# Patient Record
Sex: Female | Born: 1950 | Race: Black or African American | Hispanic: No | Smoking: Never smoker
Health system: Southern US, Community
[De-identification: ages and names within clinical notes are randomized; demographics above are authoritative.]

## PROBLEM LIST (undated history)

## (undated) DIAGNOSIS — T783XXA Angioneurotic edema, initial encounter: Secondary | ICD-10-CM

## (undated) DIAGNOSIS — Z5189 Encounter for other specified aftercare: Secondary | ICD-10-CM

## (undated) DIAGNOSIS — H409 Unspecified glaucoma: Secondary | ICD-10-CM

## (undated) DIAGNOSIS — D649 Anemia, unspecified: Secondary | ICD-10-CM

## (undated) DIAGNOSIS — E785 Hyperlipidemia, unspecified: Secondary | ICD-10-CM

## (undated) DIAGNOSIS — T7840XA Allergy, unspecified, initial encounter: Secondary | ICD-10-CM

## (undated) DIAGNOSIS — H269 Unspecified cataract: Secondary | ICD-10-CM

## (undated) DIAGNOSIS — L509 Urticaria, unspecified: Secondary | ICD-10-CM

## (undated) HISTORY — DX: Hyperlipidemia, unspecified: E78.5

## (undated) HISTORY — DX: Anemia, unspecified: D64.9

## (undated) HISTORY — DX: Urticaria, unspecified: L50.9

## (undated) HISTORY — DX: Unspecified glaucoma: H40.9

## (undated) HISTORY — DX: Angioneurotic edema, initial encounter: T78.3XXA

## (undated) HISTORY — DX: Allergy, unspecified, initial encounter: T78.40XA

## (undated) HISTORY — DX: Encounter for other specified aftercare: Z51.89

## (undated) HISTORY — DX: Unspecified cataract: H26.9

---

## 2002-02-01 ENCOUNTER — Ambulatory Visit (HOSPITAL_COMMUNITY): Admission: RE | Admit: 2002-02-01 | Discharge: 2002-02-01 | Payer: Self-pay | Admitting: Gastroenterology

## 2010-05-24 ENCOUNTER — Emergency Department (HOSPITAL_COMMUNITY): Admission: EM | Admit: 2010-05-24 | Discharge: 2010-05-24 | Payer: Self-pay | Admitting: Emergency Medicine

## 2010-11-18 ENCOUNTER — Other Ambulatory Visit: Payer: Self-pay | Admitting: Family Medicine

## 2010-11-18 DIAGNOSIS — Z1231 Encounter for screening mammogram for malignant neoplasm of breast: Secondary | ICD-10-CM

## 2010-11-23 ENCOUNTER — Ambulatory Visit
Admission: RE | Admit: 2010-11-23 | Discharge: 2010-11-23 | Disposition: A | Discharge status: Home / Self Care | Payer: BC Managed Care – PPO | Source: Ambulatory Visit | Attending: Family Medicine | Admitting: Family Medicine

## 2010-11-23 DIAGNOSIS — Z1231 Encounter for screening mammogram for malignant neoplasm of breast: Secondary | ICD-10-CM

## 2010-12-11 NOTE — Procedures (Signed)
[facility]  Patient:    Nicole Mclaughlin, Nicole Mclaughlin Visit Number: [id number] MRN: [id number]          Service Type: END Location: ENDO Attending Physician:  Nelda Marseille Dictated by:   Petra Kuba, M.D. Proc. Date: 02/01/02 Admit Date:  02/01/2002 Discharge Date: 02/01/2002                             Procedure Report  PROCEDURE:  Colonoscopy.  INDICATIONS FOR PROCEDURE:  Family history of colon cancer due for repeat screening.  Consent was signed after risks, benefits, methods, and options were thoroughly discussed in the past.  MEDICINES USED:  Demerol 80, Versed 8.  DESCRIPTION OF PROCEDURE:  Rectal inspection was pertinent for external hemorrhoids. Digital exam was negative. The video pediatric adjustable colonoscope was inserted and easily advanced around the colon to the cecum. This did not require any abdominal pressure or any position changes. The cecum was identified by the appendiceal orifice and the ileocecal valve. No abnormality was seen on insertion. The scope was slowly withdrawn. The prep was adequate. There was some liquid stool that required washing and suctioning. On slow withdrawal through the colon, no abnormalities were seen specifically no polyps or diverticula. There was some tortuosity particularly in the sigmoid. When we felt back around the loop in a sharp turn, we did try to readvance around the curve to decrease the chance of missing things. Once back in the rectum, the scope was retroflexed pertinent for some internal hemorrhoids. The scope was straightened and advanced a short ways up the left side of the colon, air was suctioned, scope removed. The patient tolerated the procedure well. There was no obvious or immediate complication.  ENDOSCOPIC DIAGNOSIS: 1. Tiny internal and external hemorrhoids. 2. Tortuous colon particularly in the sigmoid. 3. Otherwise within normal limits to the cecum.  PLAN:  GI follow-up  p.r.n. Repeat screening in five years. Yearly rectals and guaiacs per primary care or gynecology. Dictated by:   Petra Kuba, M.D. Attending Physician:  Nelda Marseille DD:  02/01/02 TD:  02/04/02 Job: [id number]

## 2011-05-13 IMAGING — CR DG ABDOMEN ACUTE W/ 1V CHEST
3 series · 3 of 3 positions shown · non-contrast
Comparison: None.

CLINICAL DATA: Constipation.

ACUTE ABDOMEN SERIES (ABDOMEN 2 VIEW & CHEST 1 VIEW)

[view not recorded (1 of 3)]
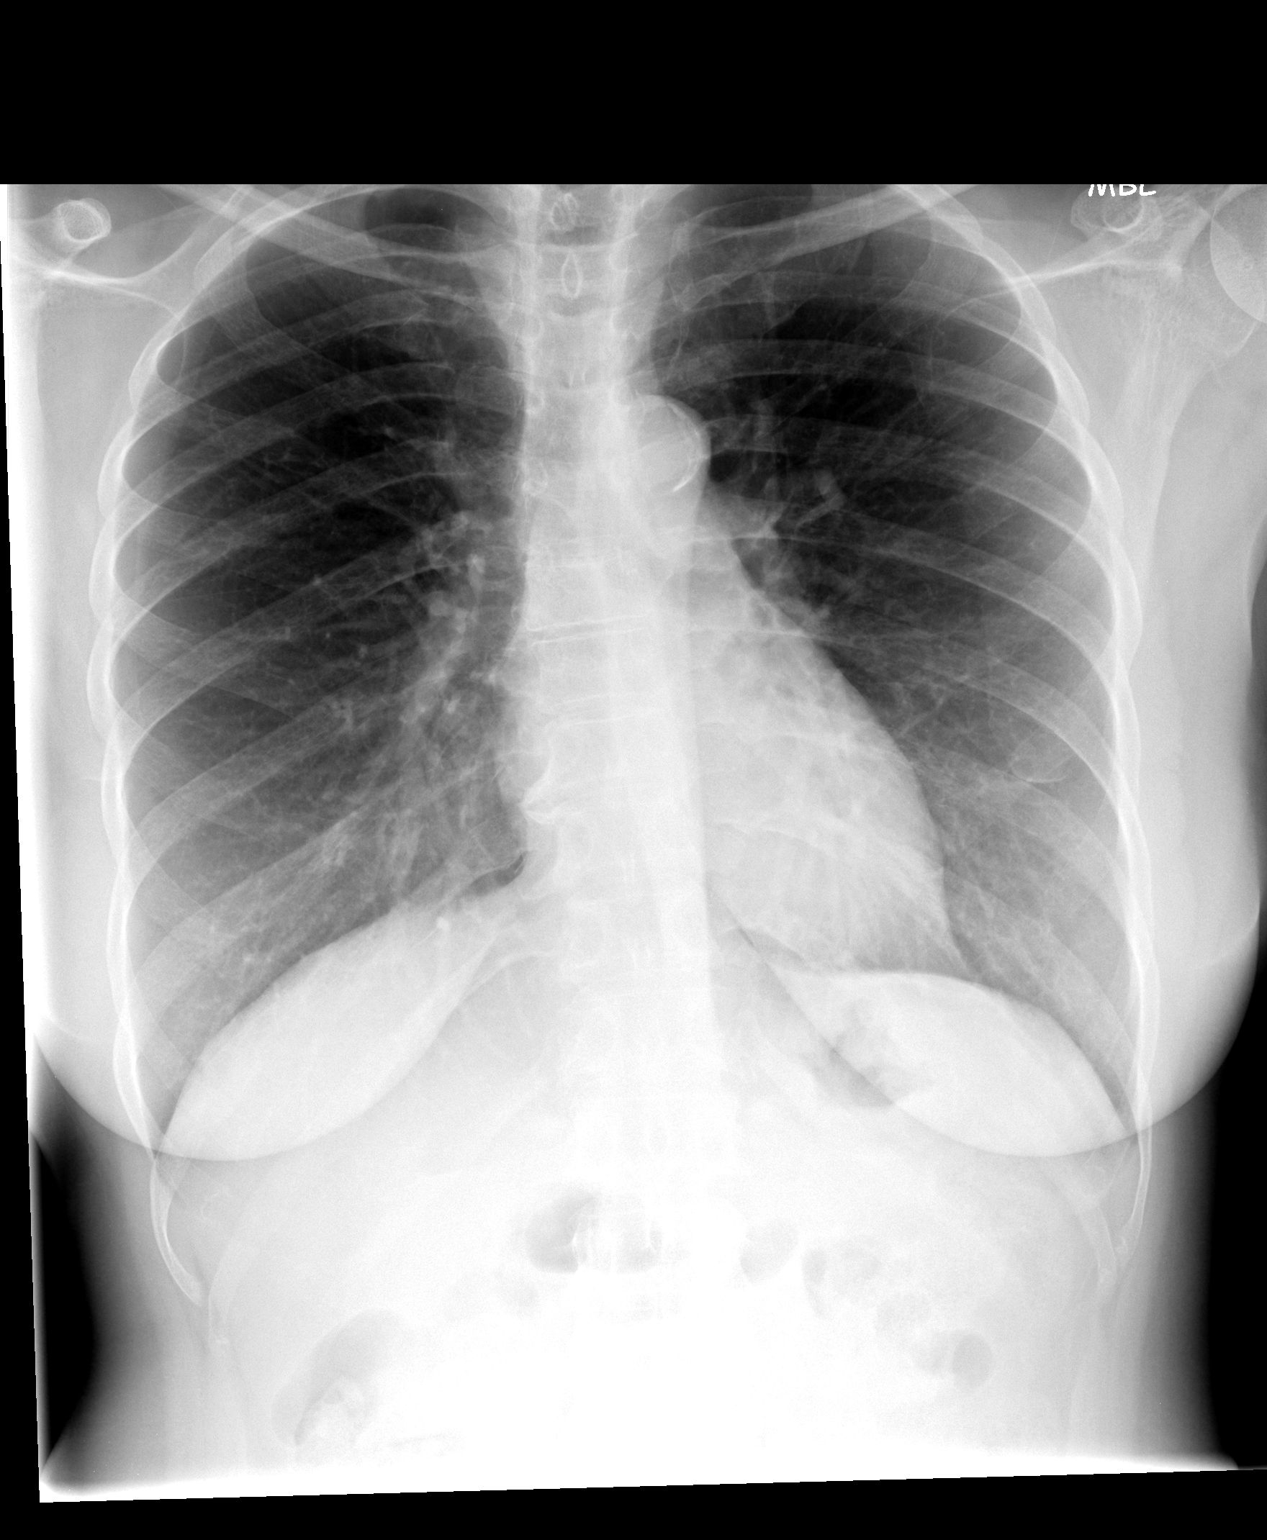

[view not recorded (2 of 3)]
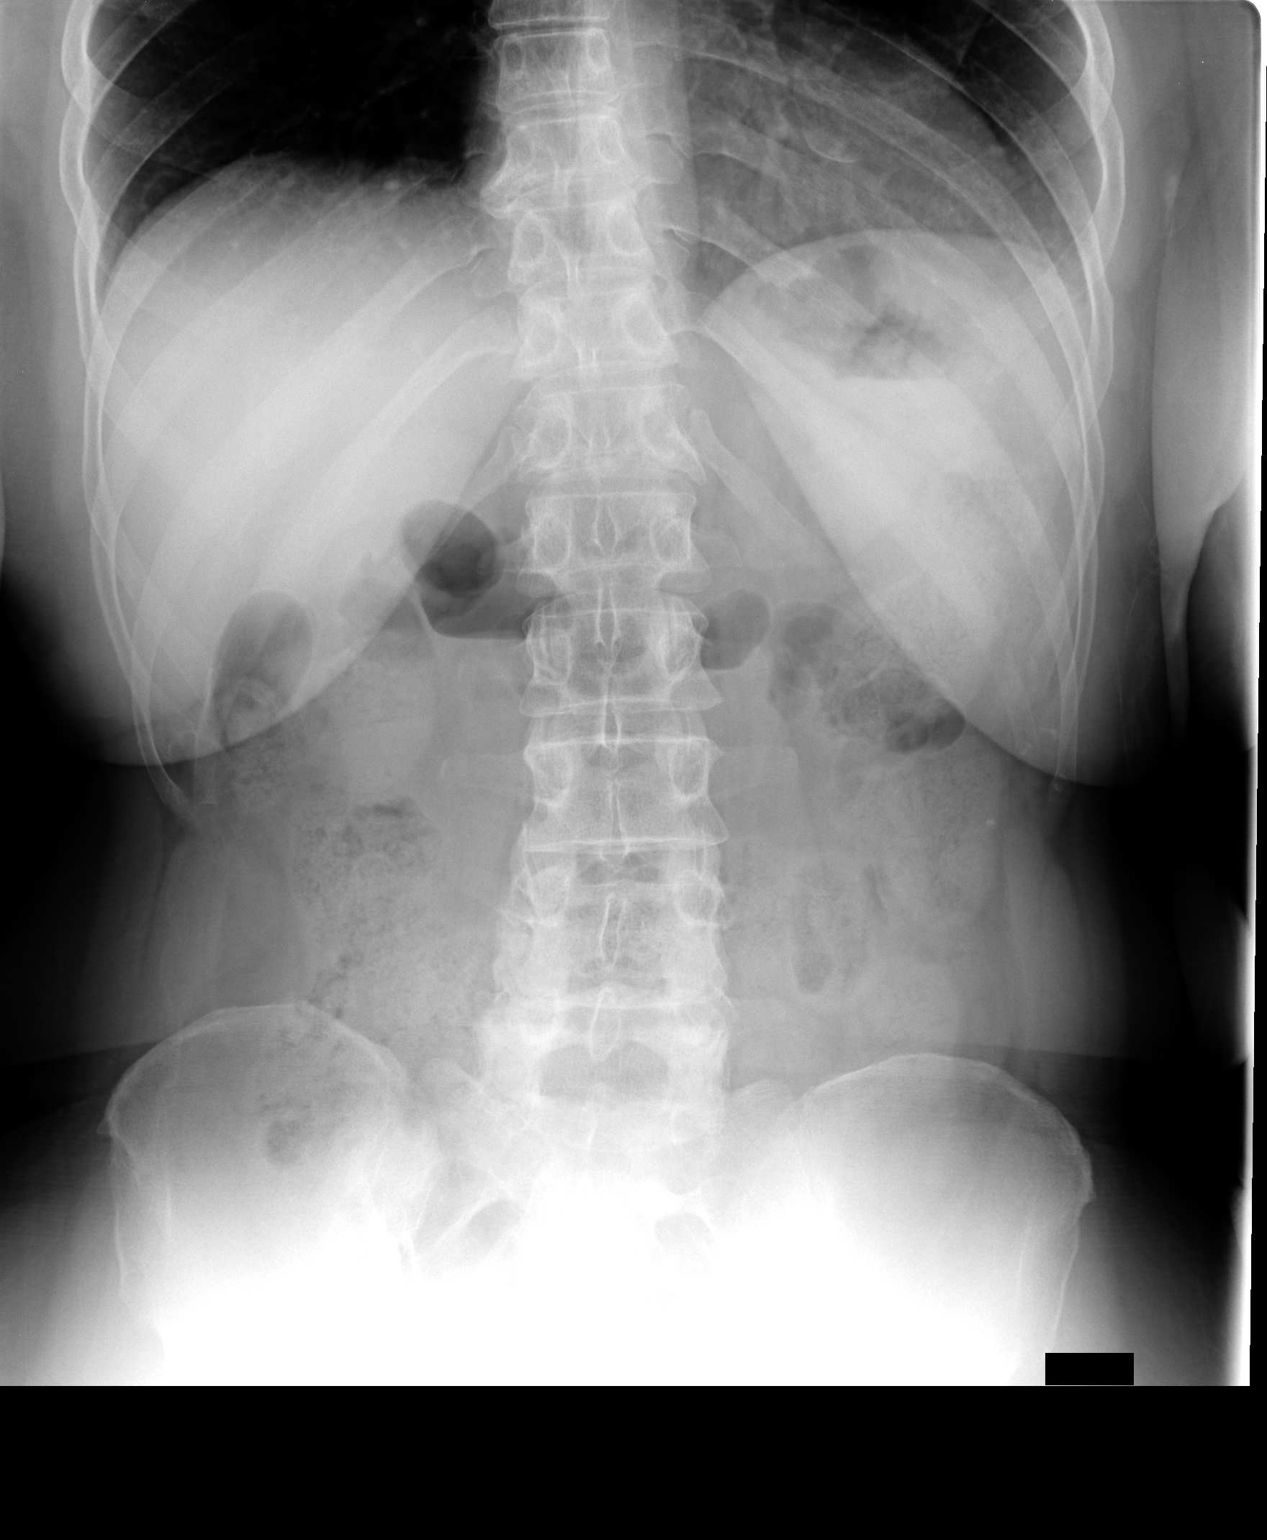

[view not recorded (3 of 3)]
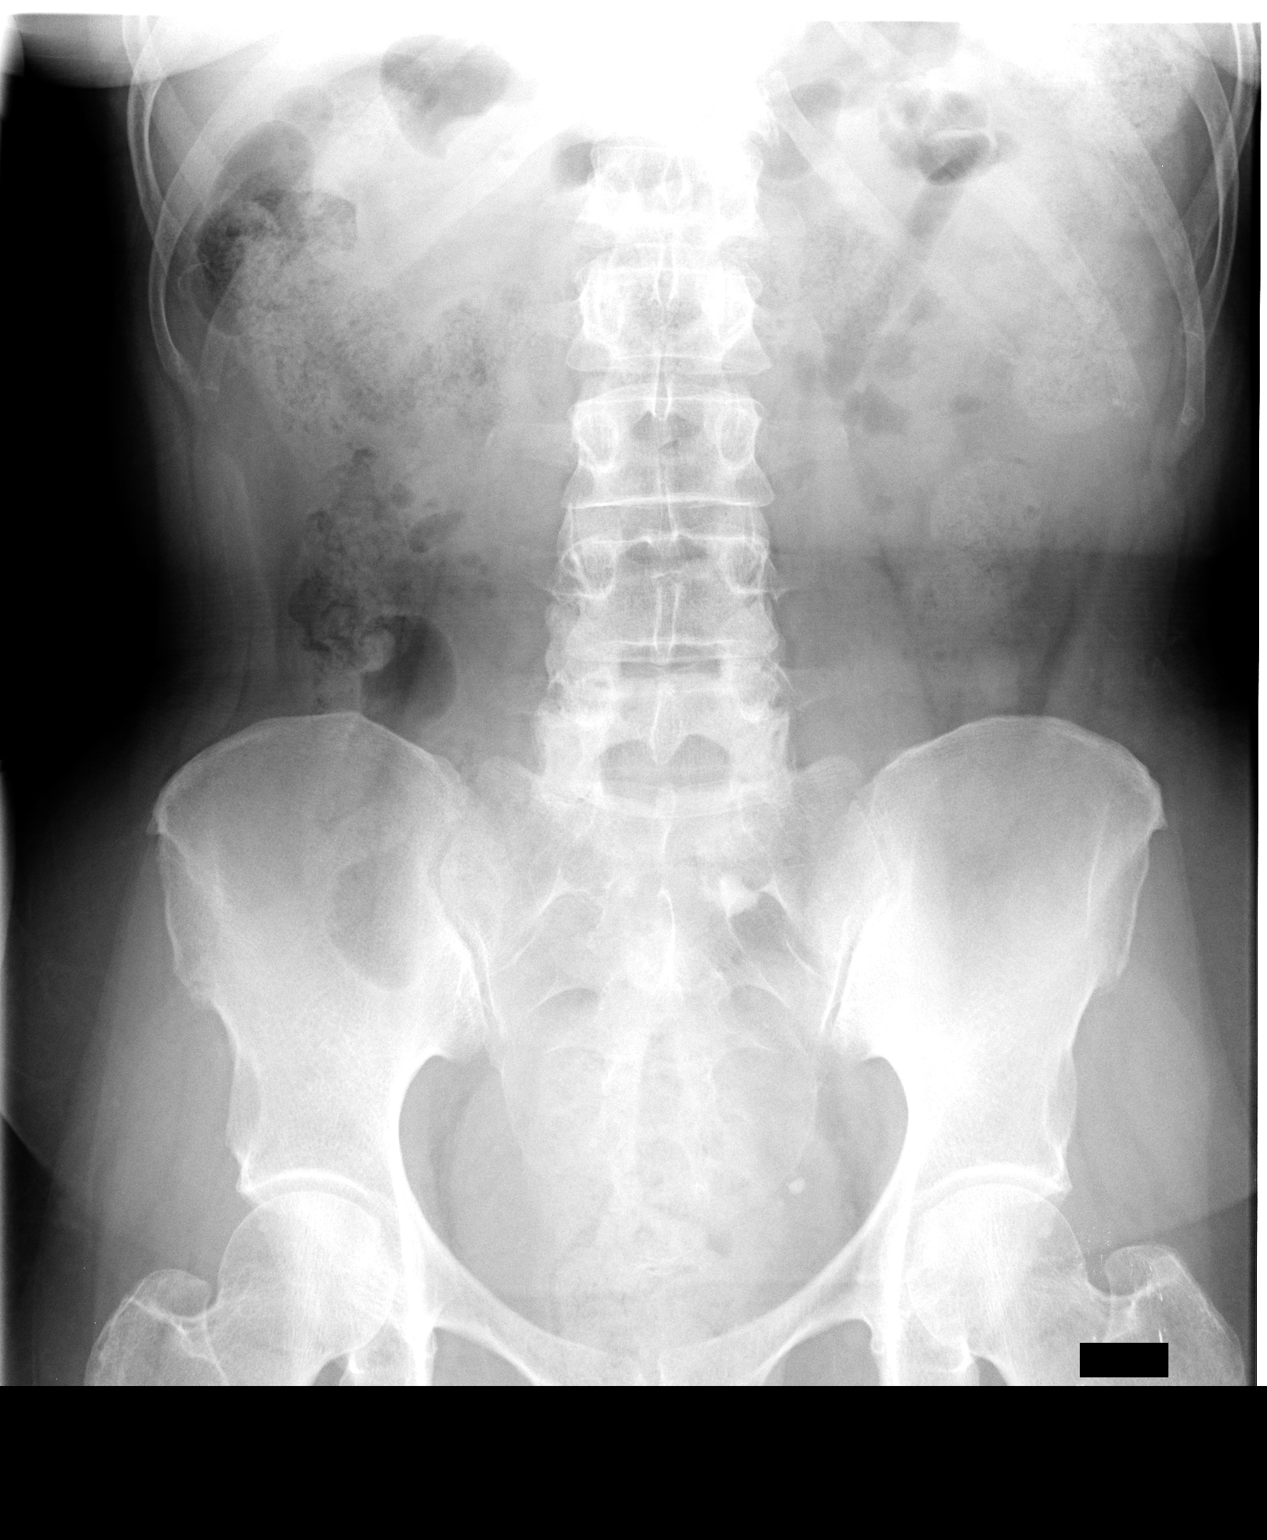

[3 of 3 positions shown; findings below may reference images not displayed]

FINDINGS: No infiltrate, congestive heart failure or pneumothorax.
Calcified aorta.  Heart size within normal limits.

Stool throughout the colon.  No plain film evidence of bowel
obstruction or free intraperitoneal air.
IMPRESSION: Stool throughout the colon.  No plain film evidence of bowel
obstruction or free intraperitoneal air.

Calcified aorta.

## 2012-07-10 ENCOUNTER — Other Ambulatory Visit: Payer: Self-pay | Admitting: Family Medicine

## 2012-07-10 DIAGNOSIS — Z1231 Encounter for screening mammogram for malignant neoplasm of breast: Secondary | ICD-10-CM

## 2012-08-16 ENCOUNTER — Ambulatory Visit (INDEPENDENT_AMBULATORY_CARE_PROVIDER_SITE_OTHER): Payer: BC Managed Care – PPO | Admitting: Family Medicine

## 2012-08-16 VITALS — BP 151/85 | HR 70 | Temp 97.8°F | Resp 16 | Ht 64.0 in | Wt 158.0 lb

## 2012-08-16 DIAGNOSIS — R0982 Postnasal drip: Secondary | ICD-10-CM

## 2012-08-16 DIAGNOSIS — H9319 Tinnitus, unspecified ear: Secondary | ICD-10-CM

## 2012-08-16 LAB — POCT CBC
Granulocyte percent: 56.3 %G (ref 37–80)
HCT, POC: 44.4 % (ref 37.7–47.9)
Lymph, poc: 2.4 (ref 0.6–3.4)
MCHC: 31.1 g/dL — AB (ref 31.8–35.4)
MCV: 92.7 fL (ref 80–97)
MID (cbc): 0.5 (ref 0–0.9)
MPV: 8.9 fL (ref 0–99.8)
POC Granulocyte: 3.8 (ref 2–6.9)
POC LYMPH PERCENT: 35.9 %L (ref 10–50)
POC MID %: 7.8 %M (ref 0–12)
Platelet Count, POC: 277 10*3/uL (ref 142–424)
RBC: 4.79 M/uL (ref 4.04–5.48)
RDW, POC: 12.4 %
WBC: 6.7 10*3/uL (ref 4.6–10.2)

## 2012-08-16 LAB — GLUCOSE, POCT (MANUAL RESULT ENTRY): POC Glucose: 80 mg/dl (ref 70–99)

## 2012-08-16 NOTE — Progress Notes (Signed)
[address]   [place], [place]  [address]   [phone]  Subjective:    Patient ID: Nicole Mclaughlin, female    DOB: 16-Jan-1951, 62 y.o.   MRN: [id number]  HPIThis 62 y.o. female presents for evaluation of ringing in ears.  Cannot tell if B ears or just one ear.  Onset yesterday evening.  No loud noise exposure.  Leaving work; got into car; turned wipers on; muffled and then pulsating starts; constant pulsating sound since last night.  Hearing normal today.No recent nasal congestion.  No fever/chills/sweats.  No ear pain; no sore throat.  No rhinorrhea.  Intermittent dizziness over the weekend before ringing in ears started.  No new medications; no ASA or Goody Powders or BC powders.  No headache; no vision changes.  Feel well.  Does suffer with chronic PND; no worsening than baseline.  Did take a walk yesterday in cold weather; heard a pop in one ear with onset of ringing in ear.  PCP: [place]   Review of Systems  Constitutional: Negative for fever, chills, diaphoresis and fatigue.  HENT: Positive for congestion and postnasal drip. Negative for sore throat, rhinorrhea, trouble swallowing and sinus pressure.   Respiratory: Negative for shortness of breath.   Cardiovascular: Negative for chest pain, palpitations and leg swelling.  Neurological: Negative for dizziness, tremors, seizures, syncope, facial asymmetry, speech difficulty, weakness, light-headedness, numbness and headaches.  Hematological: Negative for adenopathy.        Past Medical History  Diagnosis Date  . Allergy   . Anemia   . Blood transfusion without reported diagnosis     Past Surgical History  Procedure Date  . Cesarean section     Prior to Admission medications   Medication Sig Start Date End Date Taking? Authorizing Provider  Ascorbic Acid (VITAMIN C) 100 MG tablet Take 100 mg by mouth daily.   Yes Historical Provider, MD  calcium carbonate 200 MG capsule Take 250 mg by mouth 2 (two) times daily with a meal.    Yes Historical Provider, MD  lactobacillus acidophilus (BACID) TABS Take 2 tablets by mouth 3 (three) times daily.   Yes Historical Provider, MD  Specialty Vitamins Products (MAGNESIUM, AMINO ACID CHELATE,) 133 MG tablet Take 1 tablet by mouth 2 (two) times daily.   Yes Historical Provider, MD    No Known Allergies  History   Social History  . Marital Status: Divorced    Spouse Name: N/A    Number of Children: N/A  . Years of Education: N/A   Occupational History  . Not on file.   Social History Main Topics  . Smoking status: Never Smoker   . Smokeless tobacco: Not on file  . Alcohol Use: No  . Drug Use: Not on file  . Sexually Active: Not on file   Other Topics Concern  . Not on file   Social History Narrative  . No narrative on file    Family History  Problem Relation Age of Onset  . Cancer Mother 42    Colon cancer age 31; multiple myeloma age 49.  . Stroke Father   . Diabetes Sister   . Heart disease Sister     Objective:   Physical Exam  Nursing note and vitals reviewed. Constitutional: She is oriented to person, place, and time. She appears well-developed and well-nourished. No distress.  HENT:  Head: Normocephalic and atraumatic.  Right Ear: External ear normal.  Left Ear: External ear normal.  Nose: Mucosal edema present.  No rhinorrhea. Right sinus exhibits no maxillary sinus tenderness and no frontal sinus tenderness. Left sinus exhibits no maxillary sinus tenderness and no frontal sinus tenderness.  Mouth/Throat: Oropharynx is clear and moist.  Eyes: Conjunctivae normal are normal. Pupils are equal, round, and reactive to light.  Neck: Normal range of motion. Neck supple. No thyromegaly present.  Cardiovascular: Normal rate, regular rhythm and normal heart sounds.   No murmur heard.      No carotid bruits.  Pulmonary/Chest: Effort normal and breath sounds normal. She has no wheezes. She has no rales.  Lymphadenopathy:    She has no cervical  adenopathy.  Neurological: She is alert and oriented to person, place, and time. No cranial nerve deficit. She exhibits normal muscle tone. Coordination normal.  Skin: Skin is warm and dry. No rash noted. She is not diaphoretic.  Psychiatric: She has a normal mood and affect. Her behavior is normal.     Results for orders placed in visit on 08/16/12  POCT CBC      Component Value Range   WBC 6.7  4.6 - 10.2 K/uL   Lymph, poc 2.4  0.6 - 3.4   POC LYMPH PERCENT 35.9  10 - 50 %L   MID (cbc) 0.5  0 - 0.9   POC MID % 7.8  0 - 12 %M   POC Granulocyte 3.8  2 - 6.9   Granulocyte percent 56.3  37 - 80 %G   RBC 4.79  4.04 - 5.48 M/uL   Hemoglobin 13.8  12.2 - 16.2 g/dL   HCT, POC 16.1  09.6 - 47.9 %   MCV 92.7  80 - 97 fL   MCH, POC 28.8  27 - 31.2 pg   MCHC 31.1 (*) 31.8 - 35.4 g/dL   RDW, POC 04.5     Platelet Count, POC 277  142 - 424 K/uL   MPV 8.9  0 - 99.8 fL  GLUCOSE, POCT (MANUAL RESULT ENTRY)      Component Value Range   POC Glucose 80  70 - 99 mg/dl   AUDIOMETRY: NORMAL B EARS.     Assessment & Plan:   1. Tinnitus  POCT CBC, POCT glucose (manual entry), TSH, Iron  2. PND (post-nasal drip)      1.  Tinnitus:  New.  No associated hearing loss; mild congestion yet resistant to treatment.  Refer to ENT.   2.  Allergic Rhinitis/PND: chronic and persistent; declined rx for Flonase; reluctant to take oral antihistamine but will consider Claritin 10mg  daily.  May be contributing to tinnitus.

## 2012-08-16 NOTE — Patient Instructions (Addendum)
1. Tinnitus  POCT CBC, POCT glucose (manual entry), TSH, Iron, Ambulatory referral to ENT  2. PND (post-nasal drip)  Ambulatory referral to ENT

## 2012-08-17 LAB — TSH: TSH: 2.532 u[IU]/mL (ref 0.350–4.500)

## 2012-10-19 ENCOUNTER — Encounter: Payer: Self-pay | Admitting: Family Medicine

## 2013-09-25 ENCOUNTER — Ambulatory Visit (INDEPENDENT_AMBULATORY_CARE_PROVIDER_SITE_OTHER): Payer: BC Managed Care – PPO | Admitting: Family Medicine

## 2013-09-25 VITALS — BP 120/76 | HR 68 | Temp 98.2°F | Resp 16 | Ht 63.75 in | Wt 155.0 lb

## 2013-09-25 DIAGNOSIS — J329 Chronic sinusitis, unspecified: Secondary | ICD-10-CM

## 2013-09-25 MED ORDER — AMOXICILLIN 875 MG PO TABS: 875.0000 mg | ORAL_TABLET | Freq: Two times a day (BID) | ORAL | Status: DC

## 2013-09-25 NOTE — Patient Instructions (Signed)

## 2013-09-25 NOTE — Progress Notes (Signed)
This chart was scribed for Nicole Haber, MD by Nicole Mclaughlin, [facility] Scribe. The patient was seen in room and the patient's care was started at 1:24 PM.  Sinus Problem This is a recurrent problem. The current episode started 1 to 4 weeks ago. The problem is unchanged. There has been no fever. Associated symptoms include congestion, coughing, sinus pressure and a sore throat.   HPI Comments: Nicole Mclaughlin is a 63 y.o. female who arrives to the [facility] complaining of sinus problem with onset of eight days ago. She has hx of chronic post nasal drip and constant tinnitus. Pt has the associated symptoms of sore throat, post nasal drip (consists of yellow mucus sometimes mixed with blood ), nasal congestion, and mild cough. The nasal congestion is worse in the morning. Denies fever. Nothing seems to make her symptoms better. She has tried amoxicillin. She was taking Fluticasone but has stopped.   Pt is employed as a Network engineer at [employer].   Review of Systems  Constitutional: Negative for fever.  HENT: Positive for congestion, sinus pressure, sore throat and tinnitus.   Respiratory: Positive for cough and sputum production.   All other systems reviewed and are negative.   Past Medical History  Diagnosis Date   Allergy    Anemia    Blood transfusion without reported diagnosis    Current outpatient prescriptions:Ascorbic Acid (VITAMIN C) 100 MG tablet, Take 100 mg by mouth daily., Disp: , Rfl: ;  calcium carbonate 200 MG capsule, Take 250 mg by mouth 2 (two) times daily with a meal., Disp: , Rfl: ;  Specialty Vitamins Products (MAGNESIUM, AMINO ACID CHELATE,) 133 MG tablet, Take 1 tablet by mouth 2 (two) times daily., Disp: , Rfl:  amoxicillin (AMOXIL) 875 MG tablet, Take 1 tablet (875 mg total) by mouth 2 (two) times daily., Disp: 20 tablet, Rfl: 0;  lactobacillus acidophilus (BACID) TABS, Take 2 tablets by mouth 3 (three) times daily.,  Disp: , Rfl:  Allergies  Allergen Reactions   Aspirin    Motrin [Ibuprofen]    Family History  Problem Relation Age of Onset   Cancer Mother 44    Colon cancer age 84; multiple myeloma age 64.   Stroke Father    Diabetes Sister    Heart disease Sister    Objective: No acute distress, very pleasant individual HEENT: Unremarkable except for swollen nasal passages with mucopurulent discharge Neck: Supple Chest: Clear Heart: Regular no murmur  Assessment: Sinusitis, acute  Plan:Sinusitis - Plan: amoxicillin (AMOXIL) 875 MG tablet  Signed, Nicole Haber, MD

## 2014-10-15 ENCOUNTER — Encounter (HOSPITAL_COMMUNITY): Payer: Self-pay | Admitting: *Deleted

## 2014-10-15 ENCOUNTER — Emergency Department (HOSPITAL_COMMUNITY)
Admission: EM | Admit: 2014-10-15 | Discharge: 2014-10-15 | Disposition: A | Discharge status: Home / Self Care | Payer: BC Managed Care – PPO | Attending: Emergency Medicine | Admitting: Emergency Medicine

## 2014-10-15 DIAGNOSIS — Z79899 Other long term (current) drug therapy: Secondary | ICD-10-CM | POA: Diagnosis not present

## 2014-10-15 DIAGNOSIS — Z792 Long term (current) use of antibiotics: Secondary | ICD-10-CM | POA: Insufficient documentation

## 2014-10-15 DIAGNOSIS — D649 Anemia, unspecified: Secondary | ICD-10-CM | POA: Diagnosis not present

## 2014-10-15 DIAGNOSIS — K5900 Constipation, unspecified: Secondary | ICD-10-CM | POA: Diagnosis not present

## 2014-10-15 MED ORDER — MILK AND MOLASSES ENEMA
1.0000 | Freq: Once | RECTAL | Status: AC
  Administered 2014-10-15: 250 mL via RECTAL
  Filled 2014-10-15: qty 250

## 2014-10-15 NOTE — Discharge Instructions (Signed)

## 2014-10-15 NOTE — ED Notes (Signed)
Patient complains of constipation that has been unrelieved at home with enemas. She had a very small bowel movement today but does not feel relieved at all.

## 2014-10-15 NOTE — ED Provider Notes (Signed)
CSN: [id number]     Arrival date & time 10/15/14  1040 History   First MD Initiated Contact with Patient 10/15/14 1054     Chief Complaint  Patient presents with  . Constipation     (Consider location/radiation/quality/duration/timing/severity/associated sxs/prior Treatment) Patient is a 64 y.o. female presenting with constipation.  Constipation Severity:  Mild Time since last bowel movement:  1 day Timing:  Constant Progression:  Unchanged Chronicity:  Recurrent Context: not dehydration and not dietary changes   Stool description:  Watery Relieved by:  Nothing Ineffective treatments:  Laxatives and enemas (prunes) Associated symptoms: no abdominal pain, no back pain, no diarrhea, no fever and no vomiting     Past Medical History  Diagnosis Date  . Allergy   . Anemia   . Blood transfusion without reported diagnosis    Past Surgical History  Procedure Laterality Date  . Cesarean section     Family History  Problem Relation Age of Onset  . Cancer Mother 5    Colon cancer age 89; multiple myeloma age 45.  . Stroke Father   . Diabetes Sister   . Heart disease Sister    History  Substance Use Topics  . Smoking status: Never Smoker   . Smokeless tobacco: Not on file  . Alcohol Use: No   OB History    No data available     Review of Systems  Constitutional: Negative for fever.  Respiratory: Negative for cough and shortness of breath.   Gastrointestinal: Positive for constipation. Negative for vomiting, abdominal pain and diarrhea.  Musculoskeletal: Negative for back pain.  All other systems reviewed and are negative.     Allergies  Aspirin and Motrin  Home Medications   Prior to Admission medications   Medication Sig Start Date End Date Taking? Authorizing Provider  Ascorbic Acid (VITAMIN C) 100 MG tablet Take 100 mg by mouth daily.   Yes Historical Provider, MD  B Complex Vitamins (B COMPLEX PO) Take 1 tablet by mouth daily.   Yes Historical Provider,  MD  CALCIUM PO Take 1 tablet by mouth daily.   Yes Historical Provider, MD  lactobacillus acidophilus (BACID) TABS Take 1 tablet by mouth daily.    Yes Historical Provider, MD  Multiple Vitamins-Minerals (MULTIVITAMIN ADULT PO) Take 1 Package by mouth daily.   Yes Historical Provider, MD  Specialty Vitamins Products (MAGNESIUM, AMINO ACID CHELATE,) 133 MG tablet Take 1 tablet by mouth 2 (two) times daily.   Yes Historical Provider, MD  amoxicillin (AMOXIL) 875 MG tablet Take 1 tablet (875 mg total) by mouth 2 (two) times daily. Patient not taking: Reported on 10/15/2014 09/25/13   [name], MD   BP 150/75 mmHg  Pulse 100  Temp(Src) 99 F (37.2 C) (Oral)  Resp 16  SpO2 99% Physical Exam  Constitutional: She is oriented to person, place, and time. She appears well-developed and well-nourished. No distress.  HENT:  Head: Normocephalic and atraumatic.  Mouth/Throat: Oropharynx is clear and moist.  Eyes: EOM are normal. Pupils are equal, round, and reactive to light.  Neck: Normal range of motion. Neck supple.  Cardiovascular: Normal rate and regular rhythm.  Exam reveals no friction rub.   No murmur heard. Pulmonary/Chest: Effort normal and breath sounds normal. No respiratory distress. She has no wheezes. She has no rales.  Abdominal: Soft. She exhibits no distension. There is no tenderness. There is no rebound.  Genitourinary: Rectal exam shows no fissure, no mass and no tenderness. Guaiac negative stool.  Soft stool in the rectal vault  Musculoskeletal: Normal range of motion. She exhibits no edema.  Neurological: She is alert and oriented to person, place, and time. No cranial nerve deficit. She exhibits normal muscle tone. Coordination normal.  Skin: No rash noted. She is not diaphoretic.  Nursing note and vitals reviewed.   ED Course  Procedures (including critical care time) Labs Review Labs Reviewed - No data to display  Imaging Review No results found.   EKG  Interpretation None      MDM   Final diagnoses:  Constipation, unspecified constipation type    64 year old female here with constipation. Last bowel movement yesterday. Little relief with enemas and Dulcolax, but she feels like there is a large stool ball still there. Denies any fevers, vomiting, nausea. Does have some mild crampy abdominal pain. On exam belly benign. Soft stool in the vault. Will proceed with enema - milk and molasses. Enema provided great relief. Stable for discharge.  Evelina Bucy, MD 10/15/14 [phone]

## 2015-08-15 ENCOUNTER — Encounter: Payer: Self-pay | Admitting: Family Medicine

## 2015-08-20 ENCOUNTER — Encounter: Payer: Self-pay | Admitting: Family Medicine

## 2015-08-22 ENCOUNTER — Ambulatory Visit (INDEPENDENT_AMBULATORY_CARE_PROVIDER_SITE_OTHER): Payer: BC Managed Care – PPO | Admitting: Physician Assistant

## 2015-08-22 VITALS — BP 132/80 | HR 72 | Temp 98.6°F | Resp 16 | Ht 64.0 in | Wt 165.0 lb

## 2015-08-22 DIAGNOSIS — M545 Low back pain, unspecified: Secondary | ICD-10-CM

## 2015-08-22 DIAGNOSIS — M25551 Pain in right hip: Secondary | ICD-10-CM

## 2015-08-22 DIAGNOSIS — Z23 Encounter for immunization: Secondary | ICD-10-CM

## 2015-08-22 DIAGNOSIS — R3915 Urgency of urination: Secondary | ICD-10-CM

## 2015-08-22 DIAGNOSIS — Z6828 Body mass index (BMI) 28.0-28.9, adult: Secondary | ICD-10-CM | POA: Insufficient documentation

## 2015-08-22 LAB — POC MICROSCOPIC URINALYSIS (UMFC): Mucus: ABSENT

## 2015-08-22 LAB — POCT URINALYSIS DIP (MANUAL ENTRY)
Bilirubin, UA: NEGATIVE
Blood, UA: NEGATIVE
Glucose, UA: NEGATIVE
Ketones, POC UA: NEGATIVE
Nitrite, UA: NEGATIVE
Protein Ur, POC: NEGATIVE
Spec Grav, UA: <=1.005
Urobilinogen, UA: 0.2
pH, UA: 5.5

## 2015-08-22 NOTE — Patient Instructions (Signed)
Try using acetaminophen (Tylenol) for pain in the hip and low back. I will contact you when I get the results of the urine culture, though I doubt there is infection. I suspect that the urinary frequency is due in part to weak pelvic floor muscles, and also possibly in part to the constipation you experience.  To help reduce constipation and promote bowel health, 1. Drink at least 64 ounces of water each day; 2. Eat plenty of fiber (fruits, vegetables, whole grains, legumes) 3. Get plenty of physical activity  If needed, use a stool softener (docusate) or an osmotic laxative (like Miralax) each day, or as needed.  Consider support stockings (I like [employer] Total Support), supportive shoes and elevating your legs whenever you can.

## 2015-08-22 NOTE — Progress Notes (Signed)
Patient ID: Nicole Mclaughlin, female    DOB: 12-14-1950, 65 y.o.   MRN: [id number]  PCP: Lamar Blinks, MD  Subjective:   Chief Complaint  Patient presents with  . Back Pain    x 1 month  . Joint Swelling    both ankles, x 1 month    HPI Presents for evaluation of back pain, ankles/foot swelling x 1 month.  Ankles and feet. Was worse around Christmas time. And pain in my back, felt like in my kidneys. Both are improving. Neither symptoms is as bad today as it has been. Has taken no medications to alleviate her symptoms.symptoms seem improved when she first gets up in the mornings, worse in the evenings. Initially, had back pain with sit to stand. Has noticed some urinary urgency with aging, but not worse with these symptoms. Chronic constipation, but no melena or hematochezia. No fever, chills, no nausea or vomiting. No SOB, CP.  History of feet swelling in the ehat of the summer, but hasn't had that problem for years. No history of back pain prior to this. She notes some intermittent pain in the RIGHT hip, usually with stooping down, sometimes with sit-to-stand, sometimes with walking. Generally brief. Doesn't inhibit her activities.    Review of Systems As above.    There are no active problems to display for this patient.    Prior to Admission medications   Medication Sig Start Date End Date Taking? Authorizing Provider  Ascorbic Acid (VITAMIN C) 100 MG tablet Take 100 mg by mouth daily.   Yes Historical Provider, MD  CALCIUM PO Take 1 tablet by mouth daily.   Yes Historical Provider, MD  Multiple Vitamins-Minerals (MULTIVITAMIN ADULT PO) Take 1 Package by mouth daily.   Yes Historical Provider, MD  Specialty Vitamins Products (MAGNESIUM, AMINO ACID CHELATE,) 133 MG tablet Take 1 tablet by mouth 2 (two) times daily.   Yes Historical Provider, MD     Allergies  Allergen Reactions  . Aspirin Itching and Swelling  . Motrin [Ibuprofen] Itching and Swelling       Objective:  Physical Exam  Constitutional: She is oriented to person, place, and time. Vital signs are normal. She appears well-developed and well-nourished. She is active and cooperative. No distress.  BP 132/80 mmHg  Pulse 72  Temp(Src) 98.6 F (37 C) (Oral)  Resp 16  Ht 5\' 4"  (1.626 m)  Wt 165 lb (74.844 kg)  BMI 28.31 kg/m2  SpO2 99%  HENT:  Head: Normocephalic and atraumatic.  Right Ear: Hearing normal.  Left Ear: Hearing normal.  Eyes: Conjunctivae are normal. No scleral icterus.  Neck: Normal range of motion. Neck supple. No thyromegaly present.  Cardiovascular: Normal rate, regular rhythm and normal heart sounds.   Pulses:      Radial pulses are 2+ on the right side, and 2+ on the left side.  Trace non-pitting LE edema bilaterally. No varicosities. No erythema. Non-tender LE.  Pulmonary/Chest: Effort normal and breath sounds normal.  Musculoskeletal:       Right hip: Normal.       Right knee: Normal.       Right ankle: Normal. Achilles tendon normal.       Left ankle: Normal. Achilles tendon normal.       Right lower leg: She exhibits edema (trace, non-pitting). She exhibits no tenderness, no bony tenderness, no swelling, no deformity and no laceration.       Left lower leg: She exhibits edema (trace, non-pitting).  Right foot: There is swelling. There is normal range of motion, no tenderness, no bony tenderness, normal capillary refill, no crepitus, no deformity and no laceration.       Left foot: There is swelling. There is normal range of motion, no tenderness, no bony tenderness, normal capillary refill, no crepitus, no deformity and no laceration.  Lymphadenopathy:       Head (right side): No tonsillar, no preauricular, no posterior auricular and no occipital adenopathy present.       Head (left side): No tonsillar, no preauricular, no posterior auricular and no occipital adenopathy present.    She has no cervical adenopathy.       Right: No supraclavicular  adenopathy present.       Left: No supraclavicular adenopathy present.  Neurological: She is alert and oriented to person, place, and time. No sensory deficit.  Skin: Skin is warm, dry and intact. No rash noted. No cyanosis or erythema. Nails show no clubbing.  Psychiatric: She has a normal mood and affect.       Results for orders placed or performed in visit on 08/22/15  POCT urinalysis dipstick  Result Value Ref Range   Color, UA yellow yellow   Clarity, UA clear clear   Glucose, UA negative negative   Bilirubin, UA negative negative   Ketones, POC UA negative negative   Spec Grav, UA <=1.005    Blood, UA negative negative   pH, UA 5.5    Protein Ur, POC negative negative   Urobilinogen, UA 0.2    Nitrite, UA Negative Negative   Leukocytes, UA small (1+) (A) Negative  POCT Microscopic Urinalysis ([facility])  Result Value Ref Range   WBC,UR,HPF,POC None None WBC/hpf   RBC,UR,HPF,POC None None RBC/hpf   Bacteria Few (A) None, Too numerous to count   Mucus Absent Absent   Epithelial Cells, UR Per Microscopy Few (A) None, Too numerous to count cells/hpf       Assessment & Plan:   1. Urinary urgency Doubt UTI. Await UCx. Addressing constipation may also help. - POCT urinalysis dipstick - POCT Microscopic Urinalysis ([facility]) - Urine culture  2. Need for influenza vaccination - Flu Vaccine QUAD 36+ mos IM  3. Bilateral low back pain without sciatica Likely musculoskeletal. Since she doesn't take ibuprofen, trial of acetaminophen, BID x 1 week.  If symptoms worsen/persist, consider xrays.  4. Hip pain, right Acetaminophen, as above. If symptoms worsen/persist, consider xrays.   Fara Chute, PA-C Physician Assistant-Certified [facility]

## 2015-08-24 LAB — URINE CULTURE: Colony Count: 75000

## 2016-05-19 ENCOUNTER — Ambulatory Visit (INDEPENDENT_AMBULATORY_CARE_PROVIDER_SITE_OTHER): Payer: Medicare Other | Admitting: Family Medicine

## 2016-05-19 VITALS — BP 154/88 | HR 81 | Temp 98.2°F | Resp 17 | Ht 64.0 in | Wt 168.0 lb

## 2016-05-19 DIAGNOSIS — Z23 Encounter for immunization: Secondary | ICD-10-CM

## 2016-05-19 DIAGNOSIS — L989 Disorder of the skin and subcutaneous tissue, unspecified: Secondary | ICD-10-CM | POA: Diagnosis not present

## 2016-05-19 MED ORDER — CEPHALEXIN 500 MG PO CAPS: 500.0000 mg | ORAL_CAPSULE | Freq: Two times a day (BID) | ORAL | 0 refills | Status: DC

## 2016-05-19 NOTE — Progress Notes (Signed)
   Nicole Mclaughlin is a 65 y.o. female who presents to [facility] today for mole check:  1.  Mole check:  Patient has had mole on left abdomen for at least the past 10 years of not longer. States that about 18 years ago it became inflamed and infected. She is able to treat this with over-the-counter abx cream with relief.  However this hasn't helped currrently.  STarting about 1 week ago, became inflamed and painful again.  Has noted some mild drainage from here.  Wearing a band-aid for this drainage.  No fevers or chills. No surrounding redness.  ROS as above.    PMH reviewed. Patient is a nonsmoker.   Past Medical History:  Diagnosis Date  . Allergy   . Anemia   . Blood transfusion without reported diagnosis    Past Surgical History:  Procedure Laterality Date  . CESAREAN SECTION      Medications reviewed. Current Outpatient Prescriptions  Medication Sig Dispense Refill  . Ascorbic Acid (VITAMIN C) 100 MG tablet Take 100 mg by mouth daily.    Marland Kitchen CALCIUM PO Take 1 tablet by mouth daily.    . Multiple Vitamins-Minerals (MULTIVITAMIN ADULT PO) Take 1 Package by mouth daily.     No current facility-administered medications for this visit.      Physical Exam:  BP (!) 160/90 (BP Location: Right Arm, Patient Position: Sitting, Cuff Size: Normal)   Pulse 81   Temp 98.2 F (36.8 C) (Oral)   Resp 17   Ht 5\' 4"  (1.626 m)   Wt 168 lb (76.2 kg)   SpO2 99%   BMI 28.84 kg/m  Gen:  Alert, cooperative patient who appears stated age in no acute distress.  Vital signs reviewed. Skin:  1 cm in diameter, irregular, raised, hyperpigmented lesion resembling shiny seb keratosis on left mid-clavicular line of abdomen lateral to umbilicus. Does have some purulent drainage.  No surrounding erythema.  I am able to separate this partial from its base but appers to be adherent deeper than dermal layer   Assessment and Plan:  1.  Inflamed abdominal wall lesion: - Possibility that  this is just an inflamed seborrheic keratosis. -However has a rather irregular appearance -Does have appearance of infection currently. Will treat with Keflex for the next 7 days. -We'll refer to dermatology for further evaluation and possible removal as I'm not exactly sure what this lesion is.

## 2016-05-19 NOTE — Patient Instructions (Addendum)
Take the Keflex twice a day, once in AM and once in PM for x 7 days.   We'll refer you to dermatology and they will call you with an appointment to have that removed.    Your goal blood pressure would be less than 150/90.      IF you received an x-ray today, you will receive an invoice from [facility]. Please contact [facility] at [phone] with questions or concerns regarding your invoice.   IF you received labwork today, you will receive an invoice from [employer]. Please contact [facility] at [phone] with questions or concerns regarding your invoice.   Our billing staff will not be able to assist you with questions regarding bills from these companies.  You will be contacted with the lab results as soon as they are available. The fastest way to get your results is to activate your My Chart account. Instructions are located on the last page of this paperwork. If you have not heard from [place] regarding the results in 2 weeks, please contact this office.    We recommend that you schedule a mammogram for breast cancer screening. Typically, you do not need a referral to do this. Please contact a local imaging center to schedule your mammogram.  [facility] - [phone]  *ask for the Radiology Department [facility] - [phone] or [phone]  [facility] - [phone] [place] [phone] [facility] - [phone]  *ask for the [facility] - [phone]  *ask for the Radiology Department [facility] - [phone]  *ask for the [facility] - [phone]

## 2016-06-04 DIAGNOSIS — L929 Granulomatous disorder of the skin and subcutaneous tissue, unspecified: Secondary | ICD-10-CM | POA: Diagnosis not present

## 2016-06-04 DIAGNOSIS — D485 Neoplasm of uncertain behavior of skin: Secondary | ICD-10-CM | POA: Diagnosis not present

## 2016-07-27 ENCOUNTER — Encounter: Payer: Self-pay | Admitting: Physician Assistant

## 2016-07-27 ENCOUNTER — Ambulatory Visit (INDEPENDENT_AMBULATORY_CARE_PROVIDER_SITE_OTHER): Payer: Medicare Other | Admitting: Physician Assistant

## 2016-07-27 VITALS — BP 160/85 | HR 76 | Temp 98.3°F | Ht 64.0 in | Wt 168.0 lb

## 2016-07-27 DIAGNOSIS — J069 Acute upper respiratory infection, unspecified: Secondary | ICD-10-CM

## 2016-07-27 DIAGNOSIS — B9789 Other viral agents as the cause of diseases classified elsewhere: Secondary | ICD-10-CM

## 2016-07-27 MED ORDER — GUAIFENESIN ER 1200 MG PO TB12: 1.0000 | ORAL_TABLET | Freq: Two times a day (BID) | ORAL | 1 refills | Status: DC | PRN

## 2016-07-27 NOTE — Progress Notes (Signed)
[facility] [address], [place] [address] [phone]  Date:  07/27/2016   Name:  Nicole Mclaughlin   DOB:  14-Nov-1950   MRN:  [id number]  PCP:  Lamar Blinks, MD    History of Present Illness:  Nicole Mclaughlin is a 66 y.o. female patient who presents to [facility] for cc of congestion, sore throat, and cough.  7 days ago, started with a sore throat, then had nasal congestion.  Sinus pressure has started.  Some sneezing.  Cough is mild.  No fever.  No sob or dyspnea.  No ear pain.  She has taken cold medicine which has helped some.  Yellow mucus is thick and more prominent in the morning with blowing the nose.  She feels that she is starting to improve.     Patient Active Problem List   Diagnosis Date Noted  . BMI 28.0-28.9,adult 08/22/2015    Past Medical History:  Diagnosis Date  . Allergy   . Anemia   . Blood transfusion without reported diagnosis     Past Surgical History:  Procedure Laterality Date  . CESAREAN SECTION      Social History  Substance Use Topics  . Smoking status: Never Smoker  . Smokeless tobacco: Never Used  . Alcohol use No    Family History  Problem Relation Age of Onset  . Cancer Mother 6    Colon cancer age 15; multiple myeloma age 29.  . Stroke Father   . Diabetes Sister     Allergies  Allergen Reactions  . Aspirin Itching and Swelling  . Motrin [Ibuprofen] Itching and Swelling    Medication list has been reviewed and updated.  Current Outpatient Prescriptions on File Prior to Visit  Medication Sig Dispense Refill  . Ascorbic Acid (VITAMIN C) 100 MG tablet Take 100 mg by mouth daily.    Marland Kitchen CALCIUM PO Take 1 tablet by mouth daily.    . Multiple Vitamins-Minerals (MULTIVITAMIN ADULT PO) Take 1 Package by mouth daily.    . cephALEXin (KEFLEX) 500 MG capsule Take 1 capsule (500 mg total) by mouth 2 (two) times daily. (Patient not taking: Reported on 07/27/2016) 14 capsule 0   No current facility-administered medications  on file prior to visit.     ROS ROS otherwise unremarkable unless listed above.   Physical Examination: BP (!) 169/90 (BP Location: Right Arm, Patient Position: Sitting, Cuff Size: Small)   Pulse 95   Temp 98.3 F (36.8 C) (Oral)   Ht 5' 4" (1.626 m)   Wt 168 lb (76.2 kg)   SpO2 99%   BMI 28.84 kg/m  Ideal Body Weight: Weight in (lb) to have BMI = 25: 145.3  Physical Exam  Constitutional: She is oriented to person, place, and time. She appears well-developed and well-nourished. No distress.  HENT:  Head: Normocephalic and atraumatic.  Right Ear: Tympanic membrane, external ear and ear canal normal.  Left Ear: Tympanic membrane, external ear and ear canal normal.  Nose: Mucosal edema and rhinorrhea present. Right sinus exhibits no maxillary sinus tenderness and no frontal sinus tenderness. Left sinus exhibits no maxillary sinus tenderness and no frontal sinus tenderness.  Mouth/Throat: No uvula swelling. No oropharyngeal exudate, posterior oropharyngeal edema or posterior oropharyngeal erythema.  Eyes: Conjunctivae and EOM are normal. Pupils are equal, round, and reactive to light.  Cardiovascular: Normal rate and regular rhythm.  Exam reveals no gallop, no distant heart sounds and no friction rub.   No murmur heard. Pulmonary/Chest:  Effort normal. No respiratory distress. She has no decreased breath sounds. She has no wheezes. She has no rhonchi.  Lymphadenopathy:       Head (right side): No submandibular, no tonsillar, no preauricular and no posterior auricular adenopathy present.       Head (left side): No submandibular, no tonsillar, no preauricular and no posterior auricular adenopathy present.  Neurological: She is alert and oriented to person, place, and time.  Skin: She is not diaphoretic.  Psychiatric: She has a normal mood and affect. Her behavior is normal.     Assessment and Plan: Nicole Mclaughlin is a 66 y.o. female who is here today for cc of congestion, sore throat,  and cough.  Cefdinir 374m every 12 hours for 10 days 20 tablets 0 refill-- if she has no improvement within 3 days.   Viral URI with cough - Plan: Guaifenesin (MUCINEX MAXIMUM STRENGTH) 1200 MG TB12  SIvar Drape PA-C [facility] 1/7/20185:48 PM

## 2016-07-27 NOTE — Patient Instructions (Addendum)
mucinex maximum strength it is the plain guaifenesin which you will take 1200 mg every 12 hours. Hydrate well with 64 oz of water if not more.   Sinusitis, Adult Sinusitis is soreness and inflammation of your sinuses. Sinuses are hollow spaces in the bones around your face. Your sinuses are located:  Around your eyes.  In the middle of your forehead.  Behind your nose.  In your cheekbones. Your sinuses and nasal passages are lined with a stringy fluid (mucus). Mucus normally drains out of your sinuses. When your nasal tissues become inflamed or swollen, the mucus can become trapped or blocked so air cannot flow through your sinuses. This allows bacteria, viruses, and funguses to grow, which leads to infection. Sinusitis can develop quickly and last for 7?10 days (acute) or for more than 12 weeks (chronic). Sinusitis often develops after a cold. What are the causes? This condition is caused by anything that creates swelling in the sinuses or stops mucus from draining, including:  Allergies.  Asthma.  Bacterial or viral infection.  Abnormally shaped bones between the nasal passages.  Nasal growths that contain mucus (nasal polyps).  Narrow sinus openings.  Pollutants, such as chemicals or irritants in the air.  A foreign object stuck in the nose.  A fungal infection. This is rare. What increases the risk? The following factors may make you more likely to develop this condition:  Having allergies or asthma.  Having had a recent cold or respiratory tract infection.  Having structural deformities or blockages in your nose or sinuses.  Having a weak immune system.  Doing a lot of swimming or diving.  Overusing nasal sprays.  Smoking. What are the signs or symptoms? The main symptoms of this condition are pain and a feeling of pressure around the affected sinuses. Other symptoms include:  Upper toothache.  Earache.  Headache.  Bad breath.  Decreased sense of  smell and taste.  A cough that may get worse at night.  Fatigue.  Fever.  Thick drainage from your nose. The drainage is often green and it may contain pus (purulent).  Stuffy nose or congestion.  Postnasal drip. This is when extra mucus collects in the throat or back of the nose.  Swelling and warmth over the affected sinuses.  Sore throat.  Sensitivity to light. How is this diagnosed? This condition is diagnosed based on symptoms, a medical history, and a physical exam. To find out if your condition is acute or chronic, your health care provider may:  Look in your nose for signs of nasal polyps.  Tap over the affected sinus to check for signs of infection.  View the inside of your sinuses using an imaging device that has a light attached (endoscope). If your health care provider suspects that you have chronic sinusitis, you may also:  Be tested for allergies.  Have a sample of mucus taken from your nose (nasal culture) and checked for bacteria.  Have a mucus sample examined to see if your sinusitis is related to an allergy. If your sinusitis does not respond to treatment and it lasts longer than 8 weeks, you may have an MRI or CT scan to check your sinuses. These scans also help to determine how severe your infection is. In rare cases, a bone biopsy may be done to rule out more serious types of fungal sinus disease. How is this treated? Treatment for sinusitis depends on the cause and whether your condition is chronic or acute. If a virus is  causing your sinusitis, your symptoms will go away on their own within 10 days. You may be given medicines to relieve your symptoms, including:  Topical nasal decongestants. They shrink swollen nasal passages and let mucus drain from your sinuses.  Antihistamines. These drugs block inflammation that is triggered by allergies. This can help to ease swelling in your nose and sinuses.  Topical nasal corticosteroids. These are nasal  sprays that ease inflammation and swelling in your nose and sinuses.  Nasal saline washes. These rinses can help to get rid of thick mucus in your nose. If your condition is caused by bacteria, you will be given an antibiotic medicine. If your condition is caused by a fungus, you will be given an antifungal medicine. Surgery may be needed to correct underlying conditions, such as narrow nasal passages. Surgery may also be needed to remove polyps. Follow these instructions at home: Medicines  Take, use, or apply over-the-counter and prescription medicines only as told by your health care provider. These may include nasal sprays.  If you were prescribed an antibiotic medicine, take it as told by your health care provider. Do not stop taking the antibiotic even if you start to feel better. Hydrate and Humidify  Drink enough water to keep your urine clear or pale yellow. Staying hydrated will help to thin your mucus.  Use a cool mist humidifier to keep the humidity level in your home above 50%.  Inhale steam for 10-15 minutes, 3-4 times a day or as told by your health care provider. You can do this in the bathroom while a hot shower is running.  Limit your exposure to cool or dry air. Rest  Rest as much as possible.  Sleep with your head raised (elevated).  Make sure to get enough sleep each night. General instructions  Apply a warm, moist washcloth to your face 3-4 times a day or as told by your health care provider. This will help with discomfort.  Wash your hands often with soap and water to reduce your exposure to viruses and other germs. If soap and water are not available, use hand sanitizer.  Do not smoke. Avoid being around people who are smoking (secondhand smoke).  Keep all follow-up visits as told by your health care provider. This is important. Contact a health care provider if:  You have a fever.  Your symptoms get worse.  Your symptoms do not improve within 10  days. Get help right away if:  You have a severe headache.  You have persistent vomiting.  You have pain or swelling around your face or eyes.  You have vision problems.  You develop confusion.  Your neck is stiff.  You have trouble breathing. This information is not intended to replace advice given to you by your health care provider. Make sure you discuss any questions you have with your health care provider. Document Released: 07/12/2005 Document Revised: 03/07/2016 Document Reviewed: 05/07/2015 Elsevier Interactive Patient Education  2017 [employer].    IF you received an x-ray today, you will receive an invoice from [facility]. Please contact [facility] Radiology at [phone] with questions or concerns regarding your invoice.   IF you received labwork today, you will receive an invoice from [facility]. Please contact [facility] at [phone] with questions or concerns regarding your invoice.   Our billing staff will not be able to assist you with questions regarding bills from these companies.  You will be contacted with the lab results as soon as they are available.  The fastest way to get your results is to activate your My Chart account. Instructions are located on the last page of this paperwork. If you have not heard from [place] regarding the results in 2 weeks, please contact this office.

## 2016-08-03 ENCOUNTER — Encounter: Payer: Self-pay | Admitting: Family Medicine

## 2016-08-03 ENCOUNTER — Ambulatory Visit (INDEPENDENT_AMBULATORY_CARE_PROVIDER_SITE_OTHER): Payer: Medicare Other | Admitting: Family Medicine

## 2016-08-03 VITALS — BP 120/82 | HR 85 | Temp 98.5°F | Resp 16 | Ht 63.75 in | Wt 166.4 lb

## 2016-08-03 DIAGNOSIS — Z1231 Encounter for screening mammogram for malignant neoplasm of breast: Secondary | ICD-10-CM

## 2016-08-03 DIAGNOSIS — Z23 Encounter for immunization: Secondary | ICD-10-CM | POA: Diagnosis not present

## 2016-08-03 DIAGNOSIS — Z13220 Encounter for screening for lipoid disorders: Secondary | ICD-10-CM

## 2016-08-03 DIAGNOSIS — Z131 Encounter for screening for diabetes mellitus: Secondary | ICD-10-CM | POA: Diagnosis not present

## 2016-08-03 DIAGNOSIS — E2839 Other primary ovarian failure: Secondary | ICD-10-CM | POA: Diagnosis not present

## 2016-08-03 DIAGNOSIS — Z124 Encounter for screening for malignant neoplasm of cervix: Secondary | ICD-10-CM | POA: Diagnosis not present

## 2016-08-03 DIAGNOSIS — Z0000 Encounter for general adult medical examination without abnormal findings: Secondary | ICD-10-CM

## 2016-08-03 DIAGNOSIS — E663 Overweight: Secondary | ICD-10-CM | POA: Diagnosis not present

## 2016-08-03 DIAGNOSIS — Z6828 Body mass index (BMI) 28.0-28.9, adult: Secondary | ICD-10-CM

## 2016-08-03 LAB — POCT URINALYSIS DIP (MANUAL ENTRY)
Bilirubin, UA: NEGATIVE
Glucose, UA: NEGATIVE
Ketones, POC UA: NEGATIVE
Leukocytes, UA: NEGATIVE
Nitrite, UA: NEGATIVE
PH UA: 5.5
Protein Ur, POC: NEGATIVE
Spec Grav, UA: 1.015
Urobilinogen, UA: 0.2

## 2016-08-03 MED ORDER — ZOSTER VACCINE LIVE 19400 UNT/0.65ML ~~LOC~~ SUSR: 0.6500 mL | Freq: Once | SUBCUTANEOUS | 0 refills | Status: AC

## 2016-08-03 NOTE — Progress Notes (Signed)
Subjective:    Patient ID: Nicole Mclaughlin, female    DOB: 19-Aug-1950, 66 y.o.   MRN: [id number]  08/03/2016  Annual Exam (w/pap)   HPI This 66 y.o. female presents for Welcome to Medicare Physical.  Last physical:  2013 Pap smear:  2013 Mammogram: 11-25-2010 Colonoscopy:  Unsure; [facility]. Age 36 Bone density:  never Eye exam:  Years ago. Dental exam:  Last year; due.   Immunization History  Administered Date(s) Administered  . Influenza,inj,Quad PF,36+ Mos 08/22/2015, 05/19/2016  . Pneumococcal Conjugate-13 08/03/2016  . Zoster 08/17/2016   BP Readings from Last 3 Encounters:  08/03/16 120/82  07/27/16 (!) 160/85  05/19/16 (!) 154/88   Wt Readings from Last 3 Encounters:  08/03/16 166 lb 6.4 oz (75.5 kg)  07/27/16 168 lb (76.2 kg)  05/19/16 168 lb (76.2 kg)     Review of Systems  Constitutional: Negative for activity change, appetite change, chills, diaphoresis, fatigue, fever and unexpected weight change.  HENT: Negative for congestion, dental problem, drooling, ear discharge, ear pain, facial swelling, hearing loss, mouth sores, nosebleeds, postnasal drip, rhinorrhea, sinus pressure, sneezing, sore throat, tinnitus, trouble swallowing and voice change.   Eyes: Negative for photophobia, pain, discharge, redness, itching and visual disturbance.  Respiratory: Negative for apnea, cough, choking, chest tightness, shortness of breath, wheezing and stridor.   Cardiovascular: Negative for chest pain, palpitations and leg swelling.  Gastrointestinal: Negative for abdominal distention, abdominal pain, anal bleeding, blood in stool, constipation, diarrhea, nausea, rectal pain and vomiting.  Endocrine: Negative for cold intolerance, heat intolerance, polydipsia, polyphagia and polyuria.  Genitourinary: Negative for decreased urine volume, difficulty urinating, dyspareunia, dysuria, enuresis, flank pain, frequency, genital sores, hematuria, menstrual problem, pelvic pain, urgency,  vaginal bleeding, vaginal discharge and vaginal pain.  Musculoskeletal: Negative for arthralgias, back pain, gait problem, joint swelling, myalgias, neck pain and neck stiffness.  Skin: Negative for color change, pallor, rash and wound.  Allergic/Immunologic: Negative for environmental allergies, food allergies and immunocompromised state.  Neurological: Negative for dizziness, tremors, seizures, syncope, facial asymmetry, speech difficulty, weakness, light-headedness, numbness and headaches.  Hematological: Negative for adenopathy. Does not bruise/bleed easily.  Psychiatric/Behavioral: Negative for agitation, behavioral problems, confusion, decreased concentration, dysphoric mood, hallucinations, self-injury, sleep disturbance and suicidal ideas. The patient is not nervous/anxious and is not hyperactive.     Past Medical History:  Diagnosis Date  . Allergy   . Anemia   . Blood transfusion without reported diagnosis    Past Surgical History:  Procedure Laterality Date  . CESAREAN SECTION     Allergies  Allergen Reactions  . Aspirin Itching and Swelling  . Motrin [Ibuprofen] Itching and Swelling   Current Outpatient Prescriptions  Medication Sig Dispense Refill  . Ascorbic Acid (VITAMIN C) 100 MG tablet Take 100 mg by mouth daily.    Marland Kitchen CALCIUM PO Take 1 tablet by mouth daily.    . Multiple Vitamins-Minerals (MULTIVITAMIN ADULT PO) Take 1 Package by mouth daily.     No current facility-administered medications for this visit.    Social History   Social History  . Marital status: Divorced    Spouse name: n/a  . Number of children: 1  . Years of education: college   Occupational History  . retired     Programmer, systems   Social History Main Topics  . Smoking status: Never Smoker  . Smokeless tobacco: Never Used  . Alcohol use No  . Drug use: No  . Sexual activity: Not on file   Other Topics Concern  .  Not on file   Social History Narrative   Marital status: divorced since 84       Children: 1 child (64); no grandchildren      Lives:Lives alone.   Her adult daughter lives nearby.      Employment: retired in 12/2015      Tobacco: never      Alcohol: never      Exercise: sporadic; walking some in 2018; walking at park; CDs home exercise      ADLs:   Drives; independent with ADLs.      Advanced Directives:  None; FULL CODE; HCPOA: daughter   Family History  Problem Relation Age of Onset  . Cancer Mother 56    Colon cancer age 38; multiple myeloma age 43.  . Stroke Father   . Hypertension Father   . Diabetes Sister   . Alcohol abuse Sister        Objective:    BP 120/82 (BP Location: Right Arm, Cuff Size: Large)   Pulse 85   Temp 98.5 F (36.9 C) (Oral)   Resp 16   Ht 5' 3.75" (1.619 m)   Wt 166 lb 6.4 oz (75.5 kg)   SpO2 98%   BMI 28.79 kg/m  Physical Exam  Constitutional: She is oriented to person, place, and time. She appears well-developed and well-nourished. No distress.  HENT:  Head: Normocephalic and atraumatic.  Right Ear: External ear normal.  Left Ear: External ear normal.  Nose: Nose normal.  Mouth/Throat: Oropharynx is clear and moist.  Eyes: Conjunctivae and EOM are normal. Pupils are equal, round, and reactive to light.  Neck: Normal range of motion and full passive range of motion without pain. Neck supple. No JVD present. Carotid bruit is not present. No thyromegaly present.  Cardiovascular: Normal rate, regular rhythm and normal heart sounds.  Exam reveals no gallop and no friction rub.   No murmur heard. Pulmonary/Chest: Effort normal and breath sounds normal. She has no wheezes. She has no rales. Right breast exhibits no inverted nipple, no nipple discharge, no skin change and no tenderness. Left breast exhibits no inverted nipple, no mass, no nipple discharge, no skin change and no tenderness. Breasts are symmetrical.  Abdominal: Soft. Bowel sounds are normal. She exhibits no distension and no mass. There is no tenderness. There  is no rebound and no guarding.  Genitourinary: Vagina normal and uterus normal. There is no rash, tenderness, lesion or injury on the right labia. There is no rash, tenderness, lesion or injury on the left labia. Cervix exhibits no motion tenderness and no friability. Right adnexum displays no mass, no tenderness and no fullness. Left adnexum displays no mass, no tenderness and no fullness.  Musculoskeletal:       Right shoulder: Normal.       Left shoulder: Normal.       Cervical back: Normal.  Lymphadenopathy:    She has no cervical adenopathy.  Neurological: She is alert and oriented to person, place, and time. She has normal reflexes. No cranial nerve deficit. She exhibits normal muscle tone. Coordination normal.  Skin: Skin is warm and dry. No rash noted. She is not diaphoretic. No erythema. No pallor.  Psychiatric: She has a normal mood and affect. Her behavior is normal. Judgment and thought content normal.  Nursing note and vitals reviewed.  Fall Risk  08/03/2016 07/27/2016 05/19/2016  Falls in the past year? No No No   Depression screen [facility] 2/9 08/03/2016 07/27/2016 05/19/2016 08/22/2015  Decreased  Interest 0 0 0 0  Down, Depressed, Hopeless 0 0 0 0  PHQ - 2 Score 0 0 0 0   Functional Status Survey: Is the patient deaf or have difficulty hearing?: No Does the patient have difficulty seeing, even when wearing glasses/contacts?: No Does the patient have difficulty concentrating, remembering, or making decisions?: No Does the patient have difficulty walking or climbing stairs?: No Does the patient have difficulty dressing or bathing?: No Does the patient have difficulty doing errands alone such as visiting a doctor's office or shopping?: No      Assessment & Plan:   1. Welcome to Medicare preventive visit   2. Routine physical examination   3. Screening, lipid   4. Screening for diabetes mellitus   5. Overweight   6. BMI 28.0-28.9,adult   7. Encounter for Papanicolaou smear for  cervical cancer screening   8. Encounter for screening mammogram for breast cancer   9. Estrogen deficiency   10. Need for prophylactic vaccination against Streptococcus pneumoniae (pneumococcus)   11. Need for Zostavax administration     Orders Placed This Encounter  Procedures  . DG Bone Density    EPIC ORDER/ PF: MAMMO 11/06/10 BCG/NO PREV BONE DENSITY/ PT TAKES CALCIUM PT AWARE TO STOP 2 DAYS PRIOR/ NO PROBLEMS WITH BREAST/NO IMPLANTS/NO NEEDS/INS-MCR/UHC/CLC/PT    Standing Status:   Future    Number of Occurrences:   1    Standing Expiration Date:   10/01/2017    Order Specific Question:   Reason for Exam (SYMPTOM  OR DIAGNOSIS REQUIRED)    Answer:   estorgen deficiency    Order Specific Question:   Preferred imaging location?    Answer:   [facility]  . MM SCREENING BREAST TOMO BILATERAL    Standing Status:   Future    Number of Occurrences:   1    Standing Expiration Date:   10/04/2017    Order Specific Question:   Reason for Exam (SYMPTOM  OR DIAGNOSIS REQUIRED)    Answer:   annual screening    Order Specific Question:   Preferred imaging location?    Answer:   [facility]  . Pneumococcal conjugate vaccine 13-valent IM  . CBC with Differential/Platelet  . Comprehensive metabolic panel    Order Specific Question:   Has the patient fasted?    Answer:   Yes  . Lipid panel    Order Specific Question:   Has the patient fasted?    Answer:   Yes  . Care order/instruction:    AVS printed - let patient go!  Marland Kitchen POCT urinalysis dipstick  . EKG 12-Lead   Meds ordered this encounter  Medications  . Zoster Vaccine Live, PF, (ZOSTAVAX) 61607 UNT/0.65ML injection    Sig: Inject 19,400 Units into the skin once.    Dispense:  0.65 mL    Refill:  0    Return in about 1 year (around 08/03/2017) for complete physical examiniation.   Annastacia Duba Elayne Guerin, M.D. [facility] [address] [place], [place]  [phone] phone [phone] fax

## 2016-08-03 NOTE — Patient Instructions (Addendum)
   IF you received an x-ray today, you will receive an invoice from [facility]. Please contact [facility] at [phone] with questions or concerns regarding your invoice.   IF you received labwork today, you will receive an invoice from [facility]. Please contact [facility] at [phone] with questions or concerns regarding your invoice.   Our billing staff will not be able to assist you with questions regarding bills from these companies.  You will be contacted with the lab results as soon as they are available. The fastest way to get your results is to activate your My Chart account. Instructions are located on the last page of this paperwork. If you have not heard from us regarding the results in 2 weeks, please contact this office.     Keeping You Healthy  Get These Tests  Blood Pressure- Have your blood pressure checked by your healthcare provider at least once a year.  Normal blood pressure is 120/80.  Weight- Have your body mass index (BMI) calculated to screen for obesity.  BMI is a measure of body fat based on height and weight.  You can calculate your own BMI at www.nhlbisupport.com/bmi/  Cholesterol- Have your cholesterol checked every year.  Diabetes- Have your blood sugar checked every year if you have high blood pressure, high cholesterol, a family history of diabetes or if you are overweight.  Pap Test - Have a pap test every 1 to 5 years if you have been sexually active.  If you are older than 65 and recent pap tests have been normal you may not need additional pap tests.  In addition, if you have had a hysterectomy  for benign disease additional pap tests are not necessary.  Mammogram-Yearly mammograms are essential for early detection of breast cancer  Screening for Colon Cancer- Colonoscopy starting at age 50. Screening may begin sooner depending on your family history and other health conditions.  Follow up colonoscopy as directed by your  Gastroenterologist.  Screening for Osteoporosis- Screening begins at age 65 with bone density scanning, sooner if you are at higher risk for developing Osteoporosis.  Get these medicines  Calcium with Vitamin D- Your body requires 1200-1500 mg of Calcium a day and 800-1000 IU of Vitamin D a day.  You can only absorb 500 mg of Calcium at a time therefore Calcium must be taken in 2 or 3 separate doses throughout the day.  Hormones- Hormone therapy has been associated with increased risk for certain cancers and heart disease.  Talk to your healthcare provider about if you need relief from menopausal symptoms.  Aspirin- Ask your healthcare provider about taking Aspirin to prevent Heart Disease and Stroke.  Get these Immuniztions  Flu shot- Every fall  Pneumonia shot- Once after the age of 65; if you are younger ask your healthcare provider if you need a pneumonia shot.  Tetanus- Every ten years.  Zostavax- Once after the age of 60 to prevent shingles.  Take these steps  Don't smoke- Your healthcare provider can help you quit. For tips on how to quit, ask your healthcare provider or go to www.smokefree.gov or call 1-800 QUIT-NOW.  Be physically active- Exercise 5 days a week for a minimum of 30 minutes.  If you are not already physically active, start slow and gradually work up to 30 minutes of moderate physical activity.  Try walking, dancing, bike riding, swimming, etc.  Eat a healthy diet- Eat a variety of healthy foods such as fruits, vegetables, whole grains, low   fat milk, low fat cheeses, yogurt, lean meats, chicken, fish, eggs, dried beans, tofu, etc.  For more information go to www.thenutritionsource.org  Dental visit- Brush and floss teeth twice daily; visit your dentist twice a year.  Eye exam- Visit your Optometrist or Ophthalmologist yearly.  Drink alcohol in moderation- Limit alcohol intake to one drink or less a day.  Never drink and drive.  Depression- Your emotional  health is as important as your physical health.  If you're feeling down or losing interest in things you normally enjoy, please talk to your healthcare provider.  Seat Belts- can save your life; always wear one  Smoke/Carbon Monoxide detectors- These detectors need to be installed on the appropriate level of your home.  Replace batteries at least once a year.  Violence- If anyone is threatening or hurting you, please tell your healthcare provider.  Living Will/ Health care power of attorney- Discuss with your healthcare provider and family.  

## 2016-08-03 NOTE — Progress Notes (Signed)
   Subjective:    Patient ID: Nicole Mclaughlin, female    DOB: Sep 14, 1950, 66 y.o.   MRN: [id number]  HPI    Review of Systems  Constitutional: Negative.   HENT: Positive for postnasal drip, rhinorrhea and tinnitus.   Eyes: Negative.   Respiratory: Negative.   Cardiovascular: Negative.   Gastrointestinal: Positive for constipation.  Endocrine: Negative.   Genitourinary: Positive for vaginal discharge.  Musculoskeletal: Negative.   Skin: Negative.   Allergic/Immunologic: Positive for environmental allergies and food allergies.  Neurological: Negative.   Hematological: Bruises/bleeds easily.  Psychiatric/Behavioral: Negative.        Objective:   Physical Exam        Assessment & Plan:

## 2016-08-04 LAB — LIPID PANEL
CHOL/HDL RATIO: 4.3 ratio (ref 0.0–4.4)
Cholesterol, Total: 241 mg/dL — ABNORMAL HIGH (ref 100–199)
HDL: 56 mg/dL (ref >39)
LDL Calculated: 170 mg/dL — ABNORMAL HIGH (ref 0–99)
Triglycerides: 76 mg/dL (ref 0–149)
VLDL Cholesterol Cal: 15 mg/dL (ref 5–40)

## 2016-08-04 LAB — COMPREHENSIVE METABOLIC PANEL
ALBUMIN: 3.9 g/dL (ref 3.6–4.8)
ALT: 19 IU/L (ref 0–32)
AST: 19 IU/L (ref 0–40)
Albumin/Globulin Ratio: 1.3 (ref 1.2–2.2)
Alkaline Phosphatase: 67 IU/L (ref 39–117)
BUN / CREAT RATIO: 15 (ref 12–28)
BUN: 13 mg/dL (ref 8–27)
Bilirubin Total: 0.3 mg/dL (ref 0.0–1.2)
CO2: 22 mmol/L (ref 18–29)
Calcium: 9.6 mg/dL (ref 8.7–10.3)
Chloride: 104 mmol/L (ref 96–106)
Creatinine, Ser: 0.87 mg/dL (ref 0.57–1.00)
GFR calc Af Amer: 81 mL/min/{1.73_m2} (ref >59)
GFR, EST NON AFRICAN AMERICAN: 70 mL/min/{1.73_m2} (ref >59)
Globulin, Total: 3.1 g/dL (ref 1.5–4.5)
Glucose: 90 mg/dL (ref 65–99)
Potassium: 4.1 mmol/L (ref 3.5–5.2)
SODIUM: 144 mmol/L (ref 134–144)
Total Protein: 7 g/dL (ref 6.0–8.5)

## 2016-08-04 LAB — CBC WITH DIFFERENTIAL/PLATELET
Basophils Absolute: 0 10*3/uL (ref 0.0–0.2)
Basos: 0 %
EOS (ABSOLUTE): 0 10*3/uL (ref 0.0–0.4)
Eos: 1 %
HEMOGLOBIN: 11.8 g/dL (ref 11.1–15.9)
Hematocrit: 35.5 % (ref 34.0–46.6)
Immature Grans (Abs): 0 10*3/uL (ref 0.0–0.1)
Immature Granulocytes: 0 %
LYMPHS ABS: 1.1 10*3/uL (ref 0.7–3.1)
Lymphs: 20 %
MCH: 28.6 pg (ref 26.6–33.0)
MCHC: 33.2 g/dL (ref 31.5–35.7)
MCV: 86 fL (ref 79–97)
Monocytes Absolute: 0.5 10*3/uL (ref 0.1–0.9)
Monocytes: 9 %
Neutrophils Absolute: 3.9 10*3/uL (ref 1.4–7.0)
Neutrophils: 70 %
Platelets: 296 10*3/uL (ref 150–379)
RBC: 4.13 x10E6/uL (ref 3.77–5.28)
RDW: 12.2 % — ABNORMAL LOW (ref 12.3–15.4)
WBC: 5.6 10*3/uL (ref 3.4–10.8)

## 2016-08-05 LAB — PAP IG W/ RFLX HPV ASCU: PAP Smear Comment: 0

## 2016-08-17 ENCOUNTER — Ambulatory Visit
Admission: RE | Admit: 2016-08-17 | Discharge: 2016-08-17 | Disposition: A | Discharge status: Home / Self Care | Payer: Medicare Other | Source: Ambulatory Visit | Attending: Family Medicine | Admitting: Family Medicine

## 2016-08-17 DIAGNOSIS — E2839 Other primary ovarian failure: Secondary | ICD-10-CM

## 2016-08-17 DIAGNOSIS — Z1231 Encounter for screening mammogram for malignant neoplasm of breast: Secondary | ICD-10-CM

## 2016-12-29 ENCOUNTER — Encounter: Payer: Self-pay | Admitting: *Deleted

## 2017-07-18 ENCOUNTER — Ambulatory Visit: Payer: Medicare Other | Admitting: Family Medicine

## 2017-07-18 ENCOUNTER — Encounter: Payer: Self-pay | Admitting: Family Medicine

## 2017-07-18 ENCOUNTER — Other Ambulatory Visit: Payer: Self-pay

## 2017-07-18 VITALS — BP 142/58 | HR 99 | Temp 99.1°F | Ht 65.35 in | Wt 161.4 lb

## 2017-07-18 DIAGNOSIS — R0981 Nasal congestion: Secondary | ICD-10-CM

## 2017-07-18 DIAGNOSIS — R030 Elevated blood-pressure reading, without diagnosis of hypertension: Secondary | ICD-10-CM | POA: Diagnosis not present

## 2017-07-18 DIAGNOSIS — J3489 Other specified disorders of nose and nasal sinuses: Secondary | ICD-10-CM | POA: Diagnosis not present

## 2017-07-18 MED ORDER — FLUTICASONE PROPIONATE 50 MCG/ACT NA SUSP: 1.0000 | Freq: Two times a day (BID) | NASAL | 1 refills | Status: DC

## 2017-07-18 NOTE — Patient Instructions (Addendum)
1. Start flonase 1 spray twice a day, take over the counter oral decongestant (phenylpephrine), use humidifier at nighttime, increase water intake.  Nonallergic Rhinitis Nonallergic rhinitis is a condition that causes symptoms that affect the nose, such as a runny nose and a stuffed-up nose (nasal congestion) that can make it hard to breathe through the nose. This condition is different from having an allergy (allergic rhinitis). Allergic rhinitis occurs when the body's defense system (immune system) reacts to a substance that you are allergic to (allergen), such as pollen, pet dander, mold, or dust. Nonallergic rhinitis has many similar symptoms, but it is not caused by allergens. Nonallergic rhinitis can be a short-term or long-term problem. What are the causes? This condition can be caused by many different things. Some common types of nonallergic rhinitis include: Infectious rhinitis  This is usually due to an infection in the upper respiratory tract. Vasomotor rhinitis  This is the most common type of long-term nonallergic rhinitis.  It is caused by too much blood flow through the nose, which makes the tissue inside of the nose swell.  Symptoms are often triggered by strong odors, cold air, stress, drinking alcohol, cigarette smoke, or changes in the weather. Occupational rhinitis  This type is caused by triggers in the workplace, such as chemicals, dusts, animal dander, or air pollution. Hormonal rhinitis  This type occurs in women as a result of an increase in the female hormone estrogen.  It may occur during pregnancy, puberty, and menstrual cycles.  Symptoms improve when estrogen levels drop. Drug-induced rhinitis Several drugs can cause nonallergic rhinitis, including:  Medicines that are used to treat high blood pressure, heart disease, and Parkinson disease.  Aspirin and NSAIDs.  Over-the-counter nasal decongestant sprays. These can cause a type of nonallergic rhinitis  (rhinitis medicamentosa) when they are used for more than a few days.  Nonallergic rhinitis with eosinophilia syndrome (NARES)  This type is caused by having too much of a certain type of white blood cell (eosinophil). Nonallergic rhinitis can also be caused by a reaction to eating hot or spicy foods. This does not usually cause long-term symptoms. In some cases, the cause of nonallergic rhinitis is not known. What increases the risk? You are more likely to develop this condition if:  You are 33-3 years of age.  You are a woman. Women are twice as likely to have this condition.  What are the signs or symptoms? Common symptoms of this condition include:  Nasal congestion.  Runny nose.  The feeling of mucus going down the back of the throat (postnasal drip).  Trouble sleeping at night and daytime sleepiness.  Less common symptoms include:  Sneezing.  Coughing.  Itchy nose.  Bloodshot eyes.  How is this diagnosed? This condition may be diagnosed based on:  Your symptoms and medical history.  A physical exam.  Allergy testing to rule out allergic rhinitis. You may have skin tests or blood tests.  In some cases, the health care provider may take a swab of nasal secretions to look for an increased number of eosinophils. This would be done to confirm a diagnosis of NARES. How is this treated? Treatment for this condition depends on the cause. No single treatment works for everyone. Work with your health care provider to find the best treatment for you. Treatment may include:  Avoiding the things that trigger your symptoms.  Using medicines to relieve congestion, such as: ? Steroid nasal spray. There are many types. You may need to try  a few to find out which one works best. ? Decongestant medicine. This may be an oral medicine or a nasal spray. These medicines are only used for a short time.  Using medicines to relieve a runny nose. These may include antihistamine  medicines or anticholinergic nasal sprays.  Surgery to remove tissue from inside the nose may be needed in severe cases if the condition has not improved after 6-12 months of medical treatment. Follow these instructions at home:  Take or use over-the-counter and prescription medicines only as told by your health care provider. Do not stop using your medicine even if you start to feel better.  Use salt-water (saline) rinses or other solutions (nasal washes or irrigations) to wash or rinse out the inside of your nose as told by your health care provider.  Do not take NSAIDs or medicines that contain aspirin if they make your symptoms worse.  Do not drink alcohol if it makes your symptoms worse.  Do not use any tobacco products, such as cigarettes, chewing tobacco, and e-cigarettes. If you need help quitting, ask your health care provider.  Avoid secondhand smoke.  Get some exercise every day. Exercise may help reduce symptoms of nonallergic rhinitis for some people. Ask your health care provider how much exercise and what types of exercise are safe for you.  Sleep with the head of your bed raised (elevated). This may reduce nighttime nasal congestion.  Keep all follow-up visits as told by your health care provider. This is important. Contact a health care provider if:  You have a fever.  Your symptoms are getting worse at home.  Your symptoms are not responding to medicine.  You develop new symptoms, especially a headache or nosebleed. This information is not intended to replace advice given to you by your health care provider. Make sure you discuss any questions you have with your health care provider. Document Released: 11/03/2015 Document Revised: 12/18/2015 Document Reviewed: 10/02/2015 Elsevier Interactive Patient Education  2018 [employer].     IF you received an x-ray today, you will receive an invoice from [facility]. Please contact [facility] at  [phone] with questions or concerns regarding your invoice.   IF you received labwork today, you will receive an invoice from [facility]. Please contact [facility] at [phone] with questions or concerns regarding your invoice.   Our billing staff will not be able to assist you with questions regarding bills from these companies.  You will be contacted with the lab results as soon as they are available. The fastest way to get your results is to activate your My Chart account. Instructions are located on the last page of this paperwork. If you have not heard from [place] regarding the results in 2 weeks, please contact this office.

## 2017-07-18 NOTE — Progress Notes (Signed)
12/24/201811:49 AM  Nicole Mclaughlin 1951/07/22, 66 y.o. female [id number]  Chief Complaint  Patient presents with  . Sinus Problem    since Wednesday, light headache and sore throat    HPI:   Patient is a 65 y.o. female who presents today for nasal congestion, sinus pressure, sore throat, dizziness. Tends to have similar issues every winter. Denies any fever or chills, any significant cough, denies any sob, ear pain, nausea, vomiting or diarrhea. Has been taking OTC cold meds w temporary relief.  Depression screen [facility] 2/9 07/18/2017 08/03/2016 07/27/2016  Decreased Interest 0 0 0  Down, Depressed, Hopeless 0 0 0  PHQ - 2 Score 0 0 0    Allergies  Allergen Reactions  . Aspirin Itching and Swelling  . Motrin [Ibuprofen] Itching and Swelling    Prior to Admission medications   Medication Sig Start Date End Date Taking? Authorizing Provider  Ascorbic Acid (VITAMIN C) 100 MG tablet Take 100 mg by mouth daily.   Yes [provider]  CALCIUM PO Take 1 tablet by mouth daily.   Yes [provider]  Multiple Vitamins-Minerals (MULTIVITAMIN ADULT PO) Take 1 Package by mouth daily.   Yes [provider]    Past Medical History:  Diagnosis Date  . Allergy   . Anemia   . Blood transfusion without reported diagnosis     Past Surgical History:  Procedure Laterality Date  . CESAREAN SECTION      Social History   Tobacco Use  . Smoking status: Never Smoker  . Smokeless tobacco: Never Used  Substance Use Topics  . Alcohol use: No    Alcohol/week: 0.0 oz    Family History  Problem Relation Age of Onset  . Cancer Mother 46       Colon cancer age 47; multiple myeloma age 39.  . Stroke Father   . Hypertension Father   . Diabetes Sister   . Alcohol abuse Sister     ROS Per hpi  OBJECTIVE:  Blood pressure (!) 142/58, pulse 99, temperature 99.1 F (37.3 C), temperature source Oral, height 5' 5.35" (1.66 m), weight 161 lb 6.4 oz (73.2 kg), SpO2 96  %.  Physical Exam  Constitutional: She is oriented to person, place, and time and well-developed, well-nourished, and in no distress.  HENT:  Head: Normocephalic and atraumatic.  Right Ear: Hearing, tympanic membrane, external ear and ear canal normal.  Left Ear: Hearing, tympanic membrane, external ear and ear canal normal.  Mouth/Throat: Oropharynx is clear and moist.  Eyes: EOM are normal. Pupils are equal, round, and reactive to light.  Neck: Neck supple.  Cardiovascular: Normal rate, regular rhythm and normal heart sounds. Exam reveals no gallop and no friction rub.  No murmur heard. Pulmonary/Chest: Effort normal and breath sounds normal. She has no wheezes. She has no rales.  Lymphadenopathy:    She has no cervical adenopathy.  Neurological: She is alert and oriented to person, place, and time. Gait normal.  Skin: Skin is warm and dry.     ASSESSMENT and PLAN 1. Nasal congestion with rhinorrhea Discussed supportive measures, new meds r/se/b and RTC precautions. Patient educational handout given.  2. Elevated BP without diagnosis of hypertension Recheck BP at next visit  Other orders - fluticasone (FLONASE) 50 MCG/ACT nasal spray; Place 1 spray into both nostrils 2 (two) times daily.  Return if symptoms worsen or fail to improve.    Rutherford Guys, MD [facility],  [place] [address] Ph.  [phone] Fax [phone]

## 2017-08-03 ENCOUNTER — Ambulatory Visit: Payer: Medicare Other

## 2017-08-06 ENCOUNTER — Encounter: Payer: Medicare Other | Admitting: Family Medicine

## 2017-08-30 ENCOUNTER — Other Ambulatory Visit: Payer: Self-pay | Admitting: Family Medicine

## 2017-08-30 DIAGNOSIS — Z1231 Encounter for screening mammogram for malignant neoplasm of breast: Secondary | ICD-10-CM

## 2017-09-02 ENCOUNTER — Ambulatory Visit (INDEPENDENT_AMBULATORY_CARE_PROVIDER_SITE_OTHER): Payer: Medicare Other

## 2017-09-02 VITALS — BP 122/80 | HR 95 | Ht 65.0 in | Wt 161.2 lb

## 2017-09-02 DIAGNOSIS — Z23 Encounter for immunization: Secondary | ICD-10-CM | POA: Diagnosis not present

## 2017-09-02 DIAGNOSIS — Z0000 Encounter for general adult medical examination without abnormal findings: Secondary | ICD-10-CM | POA: Diagnosis not present

## 2017-09-02 NOTE — Patient Instructions (Addendum)
Ms. Nicole Mclaughlin , Thank you for taking time to come for your Medicare Wellness Visit. I appreciate your ongoing commitment to your health goals. Please review the following plan we discussed and let me know if I can assist you in the future.   Screening recommendations/referrals: Colonoscopy: up to date, next due 07/26/2022 Mammogram: up to date, next due 08/17/2018 Bone Density: up to date, next due 08/17/2021 Recommended yearly ophthalmology/optometry visit for glaucoma screening and checkup Recommended yearly dental visit for hygiene and checkup  Vaccinations: Influenza vaccine: up to date Pneumococcal vaccine: administered today  Tdap vaccine: declined due to insurance Shingles vaccine: Check with your pharmacy about receiving the Shingrix vaccine.     Advanced directives: Advance directive discussed with you today. Even though you declined this today please call our office should you change your mind and we can give you the proper paperwork for you to fill out.  Conditions/risks identified: Try to start exercising more on a consistent basis.   Next appointment: 09/12/17 @ 11:40 am with Dr. Tamala Julian, next AWV in 1 year.    Preventive Care 67 Years and Older, Female Preventive care refers to lifestyle choices and visits with your health care provider that can promote health and wellness. What does preventive care include?  A yearly physical exam. This is also called an annual well check.  Dental exams once or twice a year.  Routine eye exams. Ask your health care provider how often you should have your eyes checked.  Personal lifestyle choices, including:  Daily care of your teeth and gums.  Regular physical activity.  Eating a healthy diet.  Avoiding tobacco and drug use.  Limiting alcohol use.  Practicing safe sex.  Taking low-dose aspirin every day.  Taking vitamin and mineral supplements as recommended by your health care provider. What happens during an annual well  check? The services and screenings done by your health care provider during your annual well check will depend on your age, overall health, lifestyle risk factors, and family history of disease. Counseling  Your health care provider may ask you questions about your:  Alcohol use.  Tobacco use.  Drug use.  Emotional well-being.  Home and relationship well-being.  Sexual activity.  Eating habits.  History of falls.  Memory and ability to understand (cognition).  Work and work Statistician.  Reproductive health. Screening  You may have the following tests or measurements:  Height, weight, and BMI.  Blood pressure.  Lipid and cholesterol levels. These may be checked every 5 years, or more frequently if you are over 44 years old.  Skin check.  Lung cancer screening. You may have this screening every year starting at age 67 if you have a 30-pack-year history of smoking and currently smoke or have quit within the past 15 years.  Fecal occult blood test (FOBT) of the stool. You may have this test every year starting at age 67  Flexible sigmoidoscopy or colonoscopy. You may have a sigmoidoscopy every 5 years or a colonoscopy every 10 years starting at age 67.  Hepatitis C blood test.  Hepatitis B blood test.  Sexually transmitted disease (STD) testing.  Diabetes screening. This is done by checking your blood sugar (glucose) after you have not eaten for a while (fasting). You may have this done every 1-3 years.  Bone density scan. This is done to screen for osteoporosis. You may have this done starting at age 67.  Mammogram. This may be done every 1-2 years. Talk to your health  care provider about how often you should have regular mammograms. Talk with your health care provider about your test results, treatment options, and if necessary, the need for more tests. Vaccines  Your health care provider may recommend certain vaccines, such as:  Influenza vaccine. This is  recommended every year.  Tetanus, diphtheria, and acellular pertussis (Tdap, Td) vaccine. You may need a Td booster every 10 years.  Zoster vaccine. You may need this after age 67.  Pneumococcal 13-valent conjugate (PCV13) vaccine. One dose is recommended after age 67.  Pneumococcal polysaccharide (PPSV23) vaccine. One dose is recommended after age 67. Talk to your health care provider about which screenings and vaccines you need and how often you need them. This information is not intended to replace advice given to you by your health care provider. Make sure you discuss any questions you have with your health care provider. Document Released: 08/08/2015 Document Revised: 03/31/2016 Document Reviewed: 05/13/2015 Elsevier Interactive Patient Education  2017 [place] Prevention in the Home Falls can cause injuries. They can happen to people of all ages. There are many things you can do to make your home safe and to help prevent falls. What can I do on the outside of my home?  Regularly fix the edges of walkways and driveways and fix any cracks.  Remove anything that might make you trip as you walk through a door, such as a raised step or threshold.  Trim any bushes or trees on the path to your home.  Use bright outdoor lighting.  Clear any walking paths of anything that might make someone trip, such as rocks or tools.  Regularly check to see if handrails are loose or broken. Make sure that both sides of any steps have handrails.  Any raised decks and porches should have guardrails on the edges.  Have any leaves, snow, or ice cleared regularly.  Use sand or salt on walking paths during winter.  Clean up any spills in your garage right away. This includes oil or grease spills. What can I do in the bathroom?  Use night lights.  Install grab bars by the toilet and in the tub and shower. Do not use towel bars as grab bars.  Use non-skid mats or decals in the tub or  shower.  If you need to sit down in the shower, use a plastic, non-slip stool.  Keep the floor dry. Clean up any water that spills on the floor as soon as it happens.  Remove soap buildup in the tub or shower regularly.  Attach bath mats securely with double-sided non-slip rug tape.  Do not have throw rugs and other things on the floor that can make you trip. What can I do in the bedroom?  Use night lights.  Make sure that you have a light by your bed that is easy to reach.  Do not use any sheets or blankets that are too big for your bed. They should not hang down onto the floor.  Have a firm chair that has side arms. You can use this for support while you get dressed.  Do not have throw rugs and other things on the floor that can make you trip. What can I do in the kitchen?  Clean up any spills right away.  Avoid walking on wet floors.  Keep items that you use a lot in easy-to-reach places.  If you need to reach something above you, use a strong step stool that has a grab  bar.  Keep electrical cords out of the way.  Do not use floor polish or wax that makes floors slippery. If you must use wax, use non-skid floor wax.  Do not have throw rugs and other things on the floor that can make you trip. What can I do with my stairs?  Do not leave any items on the stairs.  Make sure that there are handrails on both sides of the stairs and use them. Fix handrails that are broken or loose. Make sure that handrails are as long as the stairways.  Check any carpeting to make sure that it is firmly attached to the stairs. Fix any carpet that is loose or worn.  Avoid having throw rugs at the top or bottom of the stairs. If you do have throw rugs, attach them to the floor with carpet tape.  Make sure that you have a light switch at the top of the stairs and the bottom of the stairs. If you do not have them, ask someone to add them for you. What else can I do to help prevent  falls?  Wear shoes that:  Do not have high heels.  Have rubber bottoms.  Are comfortable and fit you well.  Are closed at the toe. Do not wear sandals.  If you use a stepladder:  Make sure that it is fully opened. Do not climb a closed stepladder.  Make sure that both sides of the stepladder are locked into place.  Ask someone to hold it for you, if possible.  Clearly mark and make sure that you can see:  Any grab bars or handrails.  First and last steps.  Where the edge of each step is.  Use tools that help you move around (mobility aids) if they are needed. These include:  Canes.  Walkers.  Scooters.  Crutches.  Turn on the lights when you go into a dark area. Replace any light bulbs as soon as they burn out.  Set up your furniture so you have a clear path. Avoid moving your furniture around.  If any of your floors are uneven, fix them.  If there are any pets around you, be aware of where they are.  Review your medicines with your doctor. Some medicines can make you feel dizzy. This can increase your chance of falling. Ask your doctor what other things that you can do to help prevent falls. This information is not intended to replace advice given to you by your health care provider. Make sure you discuss any questions you have with your health care provider. Document Released: 05/08/2009 Document Revised: 12/18/2015 Document Reviewed: 08/16/2014 Elsevier Interactive Patient Education  2017 [employer].

## 2017-09-02 NOTE — Progress Notes (Signed)
Subjective:   Nicole Mclaughlin is a 67 y.o. female who presents for Medicare Annual (Subsequent) preventive examination.  Review of Systems:  N/A Cardiac Risk Factors include: advanced age (>35mn, >>68women)     Objective:     Vitals: BP 122/80   Pulse 95   Ht 5' 5"  (1.651 m)   Wt 161 lb 4 oz (73.1 kg)   SpO2 100%   BMI 26.83 kg/m   Body mass index is 26.83 kg/m.  Advanced Directives 09/02/2017 08/03/2016 10/15/2014  Does Patient Have a Medical Advance Directive? No No No  Does patient want to make changes to medical advance directive? No - Patient declined - -  Would patient like information on creating a medical advance directive? - - No - patient declined information    Tobacco Social History   Tobacco Use  Smoking Status Never Smoker  Smokeless Tobacco Never Used     Counseling given: Not Answered   Clinical Intake:  Pre-visit preparation completed: Yes  Pain : No/denies pain     Nutritional Status: BMI 25 -29 Overweight Nutritional Risks: None Diabetes: No  How often do you need to have someone help you when you read instructions, pamphlets, or other written materials from your doctor or pharmacy?: 1 - Never What is the last grade level you completed in school?: Bachelors Degree  Interpreter Needed?: No  Information entered by :: CAndrez Grime LPN  Past Medical History:  Diagnosis Date  . Allergy   . Anemia   . Blood transfusion without reported diagnosis    Past Surgical History:  Procedure Laterality Date  . CESAREAN SECTION     Family History  Problem Relation Age of Onset  . Cancer Mother 650      Colon cancer age 67 multiple myeloma age 67  . Stroke Father   . Hypertension Father   . Diabetes Sister   . Alcohol abuse Sister    Social History   Socioeconomic History  . Marital status: Divorced    Spouse name: n/a  . Number of children: 1  . Years of education: college  . Highest education level: Bachelor's degree (e.g., BA,  AB, BS)  Social Needs  . Financial resource strain: Not hard at all  . Food insecurity - worry: Never true  . Food insecurity - inability: Never true  . Transportation needs - medical: No  . Transportation needs - non-medical: No  Occupational History  . Occupation: retired    Comment: [employer]  Tobacco Use  . Smoking status: Never Smoker  . Smokeless tobacco: Never Used  Substance and Sexual Activity  . Alcohol use: No    Alcohol/week: 0.0 oz  . Drug use: No  . Sexual activity: None  Other Topics Concern  . None  Social History Narrative   Marital status: divorced since 1991      Children: 1 child (372; no grandchildren      Lives:Lives alone.   Her adult daughter lives nearby.      Employment: retired in 12/2015      Tobacco: never      Alcohol: never      Exercise: sporadic; walking some in 2018; walking at park; CDs home exercise      ADLs:   Drives; independent with ADLs.      Advanced Directives:  None; FULL CODE; HCPOA: daughter    Outpatient Encounter Medications as of 09/02/2017  Medication Sig  . Ascorbic Acid (VITAMIN C) 100 MG tablet Take  100 mg by mouth daily.  Marland Kitchen CALCIUM PO Take 1 tablet by mouth daily.  . diphenhydrAMINE (BENADRYL) 25 MG tablet Take 25 mg by mouth 2 (two) times daily as needed.  . fluticasone (FLONASE) 50 MCG/ACT nasal spray Place 1 spray into both nostrils 2 (two) times daily.  . Multiple Vitamins-Minerals (MULTIVITAMIN ADULT PO) Take 1 Package by mouth daily.   No facility-administered encounter medications on file as of 09/02/2017.     Activities of Daily Living In your present state of health, do you have any difficulty performing the following activities: 09/02/2017  Hearing? N  Vision? N  Difficulty concentrating or making decisions? N  Walking or climbing stairs? N  Dressing or bathing? N  Doing errands, shopping? N  Preparing Food and eating ? N  Using the Toilet? N  In the past six months, have you accidently leaked urine? N  Do you  have problems with loss of bowel control? N  Managing your Medications? N  Managing your Finances? N  Housekeeping or managing your Housekeeping? N  Some recent data might be hidden    Patient Care Team: Wardell Honour, MD as PCP - General (Family Medicine)    Assessment:   This is a routine wellness examination for Nicole Mclaughlin.  Exercise Activities and Dietary recommendations Current Exercise Habits: Home exercise routine, Type of exercise: walking, Time (Minutes): 60, Frequency (Times/Week): 2, Weekly Exercise (Minutes/Week): 120, Intensity: Mild, Exercise limited by: None identified  Goals    . Exercise 3x per week (30 min per time)     Patient states that she wants to start exercising more on a consistent basis.        Fall Risk Fall Risk  09/02/2017 07/18/2017 08/03/2016 07/27/2016 05/19/2016  Falls in the past year? No No No No No   Is the patient's home free of loose throw rugs in walkways, pet beds, electrical cords, etc?   yes      Grab bars in the bathroom? no      Handrails on the stairs?   yes      Adequate lighting?   yes  Timed Get Up and Go performed: yes, completed within 30 seconds   Depression Screen PHQ 2/9 Scores 09/02/2017 07/18/2017 08/03/2016 07/27/2016  PHQ - 2 Score 0 0 0 0     Cognitive Function     6CIT Screen 09/02/2017  What Year? 0 points  What month? 0 points  What time? 0 points  Count back from 20 0 points  Months in reverse 0 points  Repeat phrase 0 points  Total Score 0    Immunization History  Administered Date(s) Administered  . Influenza,inj,Quad PF,6+ Mos 08/22/2015, 05/19/2016  . Pneumococcal Conjugate-13 08/03/2016  . Pneumococcal Polysaccharide-23 09/02/2017  . Zoster 08/17/2016    Qualifies for Shingles Vaccine? Advised patient to check with her pharmacy about receiving the Shingrix vaccine.  Screening Tests Health Maintenance  Topic Date Due  . Hepatitis C Screening  09/12/2017 (Originally 1950-07-31)  . TETANUS/TDAP   09/02/2018 (Originally 12/03/1969)  . MAMMOGRAM  08/17/2018  . COLONOSCOPY  07/26/2022  . INFLUENZA VACCINE  Completed  . DEXA SCAN  Completed  . PNA vac Low Risk Adult  Completed    Cancer Screenings: Lung: Low Dose CT Chest recommended if Age 17-80 years, 30 pack-year currently smoking OR have quit w/in 15years. Patient does not qualify. Breast:  Up to date on Mammogram? Yes   Up to date of Bone Density/Dexa? Yes Colorectal: colonoscopy completed  07/26/2012  Additional Screenings:  Hepatitis B/HIV/Syphillis: not indicated  Hepatitis C Screening: Patient will discuss having blood work completed with Dr. Tamala Julian at her visit on 09/12/17. Wants to make sure all blood work will be covered by insurance.     Plan:   I have personally reviewed and noted the following in the patient's chart:   . Medical and social history . Use of alcohol, tobacco or illicit drugs  . Current medications and supplements . Functional ability and status . Nutritional status . Physical activity . Advanced directives . List of other physicians . Hospitalizations, surgeries, and ER visits in previous 12 months . Vitals . Screenings to include cognitive, depression, and falls . Referrals and appointments  In addition, I have reviewed and discussed with patient certain preventive protocols, quality metrics, and best practice recommendations. A written personalized care plan for preventive services as well as general preventive health recommendations were provided to patient.   1. Encounter for Medicare annual wellness exam  2. Need for 23-polyvalent pneumococcal polysaccharide vaccine - Pneumococcal 23   Andrez Grime, [place]  10/26/5523

## 2017-09-06 ENCOUNTER — Ambulatory Visit: Payer: Medicare Other

## 2017-09-07 NOTE — Progress Notes (Signed)
Subjective:    Patient ID: Nicole Mclaughlin, female    DOB: [birth year], 67 y.o.   MRN: [id number]  09/12/2017  Annual Exam    HPI This 67 y.o. female presents for Complete Physical Examination.  Last physical:  08-03-16 Pap smear: 08-03-16 WNL Mammogram:  08-17-16 Colonoscopy:  Magod; age 64. Repeat in five years.   Bone density:  08-17-2016  Osteopenia; Frax 0.7% and 4.8% Eye exam:  Due; last visit good while. Dental exam:  Every six months.  Osteopenia: does not like milk; loves cheese 1-2; likes yogurt.   Walked three times last week.  Rash: itching and bumps; not sure if eating; previous allergy shots in past.  Zyrtec makes really sleepy.      Visual Acuity Screening   Right eye Left eye Both eyes  Without correction: '20/20 20/20 20/20 '$  With correction:       BP Readings from Last 3 Encounters:  09/12/17 126/82  09/02/17 122/80  07/18/17 (!) 142/58   Wt Readings from Last 3 Encounters:  09/12/17 160 lb (72.6 kg)  09/02/17 161 lb 4 oz (73.1 kg)  07/18/17 161 lb 6.4 oz (73.2 kg)   Immunization History  Administered Date(s) Administered  . Influenza,inj,Quad PF,6+ Mos 08/22/2015, 05/19/2016  . Influenza-Unspecified 05/26/2017  . Pneumococcal Conjugate-13 08/03/2016  . Pneumococcal Polysaccharide-23 09/02/2017  . Zoster 08/17/2016    Review of Systems  Constitutional: Negative for activity change, appetite change, chills, diaphoresis, fatigue, fever and unexpected weight change.  HENT: Positive for postnasal drip, rhinorrhea, sneezing and tinnitus. Negative for congestion, dental problem, drooling, ear discharge, ear pain, facial swelling, hearing loss, mouth sores, nosebleeds, sinus pressure, sore throat, trouble swallowing and voice change.   Eyes: Positive for itching. Negative for photophobia, pain, discharge, redness and visual disturbance.  Respiratory: Negative for apnea, cough, choking, chest tightness, shortness of breath, wheezing and stridor.     Cardiovascular: Negative for chest pain, palpitations and leg swelling.  Gastrointestinal: Negative for abdominal distention, abdominal pain, anal bleeding, blood in stool, constipation, diarrhea, nausea, rectal pain and vomiting.  Endocrine: Negative for cold intolerance, heat intolerance, polydipsia, polyphagia and polyuria.  Genitourinary: Negative for decreased urine volume, difficulty urinating, dyspareunia, dysuria, enuresis, flank pain, frequency, genital sores, hematuria, menstrual problem, pelvic pain, urgency, vaginal bleeding, vaginal discharge and vaginal pain.       Nocturia x 0.  Urinary leakage none.  Musculoskeletal: Negative for arthralgias, back pain, gait problem, joint swelling, myalgias, neck pain and neck stiffness.  Skin: Negative for color change, pallor, rash and wound.  Allergic/Immunologic: Positive for environmental allergies and food allergies. Negative for immunocompromised state.  Neurological: Negative for dizziness, tremors, seizures, syncope, facial asymmetry, speech difficulty, weakness, light-headedness, numbness and headaches.  Hematological: Negative for adenopathy. Does not bruise/bleed easily.  Psychiatric/Behavioral: Negative for agitation, behavioral problems, confusion, decreased concentration, dysphoric mood, hallucinations, self-injury, sleep disturbance and suicidal ideas. The patient is not nervous/anxious and is not hyperactive.        Bedtime 11-2.  Wakes up at 9.    Past Medical History:  Diagnosis Date  . Allergy   . Anemia   . Blood transfusion without reported diagnosis   . Hyperlipidemia    Past Surgical History:  Procedure Laterality Date  . CESAREAN SECTION     Allergies  Allergen Reactions  . Aspirin Itching and Swelling  . Motrin [Ibuprofen] Itching and Swelling   Current Outpatient Medications on File Prior to Visit  Medication Sig Dispense Refill  . Ascorbic Acid (VITAMIN  C) 100 MG tablet Take 100 mg by mouth daily.    Marland Kitchen  CALCIUM PO Take 1 tablet by mouth daily.    . diphenhydrAMINE (BENADRYL) 25 MG tablet Take 25 mg by mouth 2 (two) times daily as needed.    . fluticasone (FLONASE) 50 MCG/ACT nasal spray Place 1 spray into both nostrils 2 (two) times daily. 16 g 1  . Multiple Vitamins-Minerals (MULTIVITAMIN ADULT PO) Take 1 Package by mouth daily.     No current facility-administered medications on file prior to visit.    Social History   Socioeconomic History  . Marital status: Divorced    Spouse name: n/a  . Number of children: 1  . Years of education: college  . Highest education level: Bachelor's degree (e.g., BA, AB, BS)  Social Needs  . Financial resource strain: Not hard at all  . Food insecurity - worry: Never true  . Food insecurity - inability: Never true  . Transportation needs - medical: No  . Transportation needs - non-medical: No  Occupational History  . Occupation: retired    Comment: [employer]  Tobacco Use  . Smoking status: Never Smoker  . Smokeless tobacco: Never Used  Substance and Sexual Activity  . Alcohol use: No    Alcohol/week: 0.0 oz  . Drug use: No  . Sexual activity: Not on file  Other Topics Concern  . Not on file  Social History Narrative   Marital status: divorced since 1991; not dating and not interested in 2019.      Children: 1 child (33); no grandchildren      Lives:Lives alone.   Her adult daughter lives nearby.      Employment: retired in 12/2015      Tobacco: never      Alcohol: never      Exercise: sporadic; walking some in 2018; walking at park; CDs home exercise      ADLs:   Drives; independent with ADLs.      Advanced Directives:  None; FULL CODE; HCPOA: daughter   Family History  Problem Relation Age of Onset  . Cancer Mother 15       Colon cancer age 71; multiple myeloma age 30.  . Stroke Father   . Hypertension Father   . Diabetes Sister   . Alcohol abuse Sister   . Breast cancer Neg Hx        Objective:    BP 126/82   Pulse 89    Temp 98 F (36.7 C) (Oral)   Resp 16   Ht 5' 4.17" (1.63 m)   Wt 160 lb (72.6 kg)   SpO2 98%   BMI 27.32 kg/m  Physical Exam  Constitutional: She is oriented to person, place, and time. She appears well-developed and well-nourished. No distress.  HENT:  Head: Normocephalic and atraumatic.  Right Ear: Hearing, tympanic membrane, external ear and ear canal normal.  Left Ear: Hearing, tympanic membrane, external ear and ear canal normal.  Nose: Nose normal.  Mouth/Throat: Oropharynx is clear and moist.  Eyes: Conjunctivae and EOM are normal. Pupils are equal, round, and reactive to light.  Neck: Normal range of motion and full passive range of motion without pain. Neck supple. No JVD present. Carotid bruit is not present. No thyromegaly present.  Cardiovascular: Normal rate, regular rhythm, normal heart sounds and intact distal pulses. Exam reveals no gallop and no friction rub.  No murmur heard. Pulmonary/Chest: Effort normal and breath sounds normal. No respiratory distress. She has no  wheezes. She has no rales. Right breast exhibits no inverted nipple, no mass, no nipple discharge, no skin change and no tenderness. Left breast exhibits no inverted nipple, no mass, no nipple discharge, no skin change and no tenderness. Breasts are symmetrical.  Abdominal: Soft. Bowel sounds are normal. She exhibits no distension and no mass. There is no tenderness. There is no rebound and no guarding.  Musculoskeletal:       Right shoulder: Normal.       Left shoulder: Normal.       Cervical back: Normal.  Lymphadenopathy:    She has no cervical adenopathy.  Neurological: She is alert and oriented to person, place, and time. She has normal reflexes. No cranial nerve deficit. She exhibits normal muscle tone. Coordination normal.  Skin: Skin is warm and dry. No rash noted. She is not diaphoretic. No erythema. No pallor.  Psychiatric: She has a normal mood and affect. Her behavior is normal. Judgment and  thought content normal.  Nursing note and vitals reviewed.  No results found. Depression screen [facility] 2/9 09/12/2017 09/02/2017 07/18/2017 08/03/2016 07/27/2016  Decreased Interest 0 0 0 0 0  Down, Depressed, Hopeless 0 0 0 0 0  PHQ - 2 Score 0 0 0 0 0   Fall Risk  09/12/2017 09/02/2017 07/18/2017 08/03/2016 07/27/2016  Falls in the past year? No No No No No        Assessment & Plan:   1. Routine physical examination   2. Seasonal allergic rhinitis due to pollen   3. Hypercholesterolemia   4. Screening for diabetes mellitus   5. Pruritus   6. Osteopenia of multiple sites   7. Need for shingles vaccine    -anticipatory guidance provided --- exercise, weight loss, safe driving practices, aspirin '81mg'$  daily. -obtain age appropriate screening labs and labs for chronic disease management. -low fall risk; no evidence of depression; no evidence of hearing loss.  Discussed advanced directives and living will; also discussed end of life issues including code status.  Chronic pruritus syndrome: Refer to allergy and immunology for consultation.  May warrant dermatology consultation as well.  No rash evident today on examination. Hypercholesterolemia: Repeat labs today.  I recommend weight loss, exercise, and low-cholesterol low-fat food choices.  I recommend limiting red meat to once per week; I recommend limiting fried foods to once per month. Osteopenia: Recommend 3 servings of dairy daily or calcium 600 mg twice daily, vitamin D 800 international units daily, and weightbearing exercise daily for 30 minutes.  Orders Placed This Encounter  Procedures  . CBC with Differential/Platelet  . Comprehensive metabolic panel    Order Specific Question:   Has the patient fasted?    Answer:   No  . Lipid panel    Order Specific Question:   Has the patient fasted?    Answer:   No  . Ambulatory referral to Allergy    Referral Priority:   Routine    Referral Type:   Allergy Testing    Referral Reason:    Specialty Services Required    Requested Specialty:   Allergy    Number of Visits Requested:   1  . POCT urinalysis dipstick   Meds ordered this encounter  Medications  . Zoster Vaccine Adjuvanted [facility]) injection    Sig: Inject 0.5 mLs into the muscle once for 1 dose.    Dispense:  0.5 mL    Refill:  1    Return in about 1 year (around 09/12/2018) for  complete physical examiniation.   Almon Whitford Elayne Guerin, M.D. Primary Care at [facility] [place] [address] [place], [place]  [address] [phone] phone [phone] fax

## 2017-09-12 ENCOUNTER — Ambulatory Visit (INDEPENDENT_AMBULATORY_CARE_PROVIDER_SITE_OTHER): Payer: Medicare Other | Admitting: Family Medicine

## 2017-09-12 ENCOUNTER — Other Ambulatory Visit: Payer: Self-pay

## 2017-09-12 ENCOUNTER — Encounter: Payer: Self-pay | Admitting: Family Medicine

## 2017-09-12 VITALS — BP 126/82 | HR 89 | Temp 98.0°F | Resp 16 | Ht 64.17 in | Wt 160.0 lb

## 2017-09-12 DIAGNOSIS — Z0000 Encounter for general adult medical examination without abnormal findings: Secondary | ICD-10-CM | POA: Diagnosis not present

## 2017-09-12 DIAGNOSIS — J301 Allergic rhinitis due to pollen: Secondary | ICD-10-CM

## 2017-09-12 DIAGNOSIS — L299 Pruritus, unspecified: Secondary | ICD-10-CM | POA: Diagnosis not present

## 2017-09-12 DIAGNOSIS — E7800 Pure hypercholesterolemia, unspecified: Secondary | ICD-10-CM | POA: Diagnosis not present

## 2017-09-12 DIAGNOSIS — M8589 Other specified disorders of bone density and structure, multiple sites: Secondary | ICD-10-CM

## 2017-09-12 DIAGNOSIS — Z23 Encounter for immunization: Secondary | ICD-10-CM | POA: Diagnosis not present

## 2017-09-12 DIAGNOSIS — Z131 Encounter for screening for diabetes mellitus: Secondary | ICD-10-CM

## 2017-09-12 LAB — POCT URINALYSIS DIP (MANUAL ENTRY)
Bilirubin, UA: NEGATIVE
Blood, UA: NEGATIVE
Glucose, UA: NEGATIVE mg/dL
Ketones, POC UA: NEGATIVE mg/dL
LEUKOCYTES UA: NEGATIVE
NITRITE UA: NEGATIVE
Protein Ur, POC: NEGATIVE mg/dL
Spec Grav, UA: 1.01 (ref 1.010–1.025)
UROBILINOGEN UA: 0.2 U/dL
pH, UA: 7 (ref 5.0–8.0)

## 2017-09-12 NOTE — Patient Instructions (Addendum)
Consider Allegra or Xyzal for itching.    IF you received an x-ray today, you will receive an invoice from [facility]. Please contact [facility] at [phone] with questions or concerns regarding your invoice.   IF you received labwork today, you will receive an invoice from [facility]. Please contact [facility] at [phone] with questions or concerns regarding your invoice.   Our billing staff will not be able to assist you with questions regarding bills from these companies.  You will be contacted with the lab results as soon as they are available. The fastest way to get your results is to activate your My Chart account. Instructions are located on the last page of this paperwork. If you have not heard from [place] regarding the results in 2 weeks, please contact this office.      Preventive Care 67 Years and Older, Female Preventive care refers to lifestyle choices and visits with your health care provider that can promote health and wellness. What does preventive care include?  A yearly physical exam. This is also called an annual well check.  Dental exams once or twice a year.  Routine eye exams. Ask your health care provider how often you should have your eyes checked.  Personal lifestyle choices, including: ? Daily care of your teeth and gums. ? Regular physical activity. ? Eating a healthy diet. ? Avoiding tobacco and drug use. ? Limiting alcohol use. ? Practicing safe sex. ? Taking low-dose aspirin every day. ? Taking vitamin and mineral supplements as recommended by your health care provider. What happens during an annual well check? The services and screenings done by your health care provider during your annual well check will depend on your age, overall health, lifestyle risk factors, and family history of disease. Counseling Your health care provider may ask you questions about your:  Alcohol use.  Tobacco use.  Drug use.  Emotional  well-being.  Home and relationship well-being.  Sexual activity.  Eating habits.  History of falls.  Memory and ability to understand (cognition).  Work and work Statistician.  Reproductive health.  Screening You may have the following tests or measurements:  Height, weight, and BMI.  Blood pressure.  Lipid and cholesterol levels. These may be checked every 5 years, or more frequently if you are over 53 years old.  Skin check.  Lung cancer screening. You may have this screening every year starting at age 78 if you have a 30-pack-year history of smoking and currently smoke or have quit within the past 15 years.  Fecal occult blood test (FOBT) of the stool. You may have this test every year starting at age 47.  Flexible sigmoidoscopy or colonoscopy. You may have a sigmoidoscopy every 5 years or a colonoscopy every 10 years starting at age 100.  Hepatitis C blood test.  Hepatitis B blood test.  Sexually transmitted disease (STD) testing.  Diabetes screening. This is done by checking your blood sugar (glucose) after you have not eaten for a while (fasting). You may have this done every 1-3 years.  Bone density scan. This is done to screen for osteoporosis. You may have this done starting at age 20.  Mammogram. This may be done every 1-2 years. Talk to your health care provider about how often you should have regular mammograms.  Talk with your health care provider about your test results, treatment options, and if necessary, the need for more tests. Vaccines Your health care provider may recommend certain vaccines, such as:  Influenza  vaccine. This is recommended every year.  Tetanus, diphtheria, and acellular pertussis (Tdap, Td) vaccine. You may need a Td booster every 10 years.  Varicella vaccine. You may need this if you have not been vaccinated.  Zoster vaccine. You may need this after age 20.  Measles, mumps, and rubella (MMR) vaccine. You may need at least  one dose of MMR if you were born in 1957 or later. You may also need a second dose.  Pneumococcal 13-valent conjugate (PCV13) vaccine. One dose is recommended after age 40.  Pneumococcal polysaccharide (PPSV23) vaccine. One dose is recommended after age 59.  Meningococcal vaccine. You may need this if you have certain conditions.  Hepatitis A vaccine. You may need this if you have certain conditions or if you travel or work in places where you may be exposed to hepatitis A.  Hepatitis B vaccine. You may need this if you have certain conditions or if you travel or work in places where you may be exposed to hepatitis B.  Haemophilus influenzae type b (Hib) vaccine. You may need this if you have certain conditions.  Talk to your health care provider about which screenings and vaccines you need and how often you need them. This information is not intended to replace advice given to you by your health care provider. Make sure you discuss any questions you have with your health care provider. Document Released: 08/08/2015 Document Revised: 03/31/2016 Document Reviewed: 05/13/2015 Elsevier Interactive Patient Education  [employer].

## 2017-09-13 LAB — COMPREHENSIVE METABOLIC PANEL
ALT: 22 IU/L (ref 0–32)
AST: 23 IU/L (ref 0–40)
Albumin/Globulin Ratio: 1.3 (ref 1.2–2.2)
Albumin: 4 g/dL (ref 3.6–4.8)
Alkaline Phosphatase: 71 IU/L (ref 39–117)
BUN/Creatinine Ratio: 10 — ABNORMAL LOW (ref 12–28)
BUN: 9 mg/dL (ref 8–27)
Bilirubin Total: 0.5 mg/dL (ref 0.0–1.2)
CO2: 23 mmol/L (ref 20–29)
Calcium: 9.2 mg/dL (ref 8.7–10.3)
Chloride: 105 mmol/L (ref 96–106)
Creatinine, Ser: 0.89 mg/dL (ref 0.57–1.00)
GFR calc Af Amer: 78 mL/min/{1.73_m2} (ref >59)
GFR calc non Af Amer: 68 mL/min/{1.73_m2} (ref >59)
Globulin, Total: 3.1 g/dL (ref 1.5–4.5)
Glucose: 81 mg/dL (ref 65–99)
Potassium: 4 mmol/L (ref 3.5–5.2)
Sodium: 142 mmol/L (ref 134–144)
Total Protein: 7.1 g/dL (ref 6.0–8.5)

## 2017-09-13 LAB — CBC WITH DIFFERENTIAL/PLATELET
Basophils Absolute: 0 10*3/uL (ref 0.0–0.2)
Basos: 0 %
EOS (ABSOLUTE): 0 10*3/uL (ref 0.0–0.4)
Eos: 1 %
Hematocrit: 36.7 % (ref 34.0–46.6)
Hemoglobin: 12.3 g/dL (ref 11.1–15.9)
Immature Grans (Abs): 0 10*3/uL (ref 0.0–0.1)
Immature Granulocytes: 0 %
Lymphocytes Absolute: 1.2 10*3/uL (ref 0.7–3.1)
Lymphs: 31 %
MCH: 29 pg (ref 26.6–33.0)
MCHC: 33.5 g/dL (ref 31.5–35.7)
MCV: 87 fL (ref 79–97)
Monocytes Absolute: 0.4 10*3/uL (ref 0.1–0.9)
Monocytes: 10 %
Neutrophils Absolute: 2.2 10*3/uL (ref 1.4–7.0)
Neutrophils: 58 %
Platelets: 285 10*3/uL (ref 150–379)
RBC: 4.24 x10E6/uL (ref 3.77–5.28)
RDW: 12.8 % (ref 12.3–15.4)
WBC: 3.8 10*3/uL (ref 3.4–10.8)

## 2017-09-13 LAB — LIPID PANEL
Chol/HDL Ratio: 3.9 ratio (ref 0.0–4.4)
Cholesterol, Total: 232 mg/dL — ABNORMAL HIGH (ref 100–199)
HDL: 59 mg/dL
LDL Calculated: 160 mg/dL — ABNORMAL HIGH (ref 0–99)
Triglycerides: 66 mg/dL (ref 0–149)
VLDL Cholesterol Cal: 13 mg/dL (ref 5–40)

## 2017-09-16 ENCOUNTER — Ambulatory Visit
Admission: RE | Admit: 2017-09-16 | Discharge: 2017-09-16 | Disposition: A | Discharge status: Home / Self Care | Payer: Medicare Other | Source: Ambulatory Visit | Attending: Family Medicine | Admitting: Family Medicine

## 2017-09-16 DIAGNOSIS — Z1231 Encounter for screening mammogram for malignant neoplasm of breast: Secondary | ICD-10-CM

## 2017-09-20 MED ORDER — ZOSTER VAC RECOMB ADJUVANTED 50 MCG/0.5ML IM SUSR: 0.5000 mL | Freq: Once | INTRAMUSCULAR | 1 refills | Status: AC

## 2017-10-03 ENCOUNTER — Encounter: Payer: Self-pay | Admitting: Allergy & Immunology

## 2017-10-03 ENCOUNTER — Ambulatory Visit: Payer: Medicare Other | Admitting: Allergy & Immunology

## 2017-10-03 VITALS — BP 140/82 | HR 92 | Temp 98.4°F | Resp 20 | Ht 64.0 in | Wt 163.6 lb

## 2017-10-03 DIAGNOSIS — L508 Other urticaria: Secondary | ICD-10-CM | POA: Diagnosis not present

## 2017-10-03 DIAGNOSIS — J302 Other seasonal allergic rhinitis: Secondary | ICD-10-CM

## 2017-10-03 DIAGNOSIS — J3089 Other allergic rhinitis: Secondary | ICD-10-CM | POA: Diagnosis not present

## 2017-10-03 NOTE — Progress Notes (Signed)
NEW PATIENT  Date of Service/Encounter:  10/03/17  Referring provider: Wardell Honour, MD   Assessment:   Chronic urticaria - in the setting of an NSAID allergy  Seasonal and perennial allergic rhinitis (trees, weeds, grasses, indoor molds, dust mites and cockroach)  Plan/Recommendations:   1. Chronic urticaria - in the setting of an NSAID allergy - Your history does not have any "red flags" such as fevers, joint pains, or permanent skin changes that would be concerning for a more serious cause of hives.  - Testing was slightly reactive to peanuts, soy, wheat, and sesame. - There is a the low positive predictive value of food allergy testing and hence the high possibility of false positives. - In contrast, food allergy testing has a high negative predictive value, therefore if testing is negative we can be relatively assured that they are indeed negative.  - Therefore, I would recommend stopping these in your diet for 2-4 weeks to see if the hives improve.  - If you do not notice an improvement in the hives, go ahead and restart these foods. - We did provide a sheet for a low salicylic diet to see if this would help with controlling her urticaria.  - We will get some labs to rule out serious causes of hives: tryptase level, thyroid antibodies, and alpha gal panel. - Chronic hives are often times a self limited process and will "burn themselves out" over 6-12 months, although this is not always the case.  - In the meantime, start suppressive dosing of antihistamines:   - Morning: Allegra (fexofenadine) 180-370m (one to two tablets)  - Evening: Zyrtec (cetirizine) 10-293m(one to two tablets)  - If the above is not working, try adding: Zantac (ranitidine) 15041mtwo tablets) - You can change this dosing at home, decreasing the dose as needed or increasing the dosing as needed.  - If you are not tolerating the medications or are tired of taking them every day, we can start treatment  with a monthly injectable medication called Xolair.   2. Chronic rhinitis - Testing today showed: trees, weeds, grasses, indoor molds, dust mites and cockroach - Avoidance measures provided. - Start taking: nasal saline rinses 1-2 times daily and antihistamines (as above for urticaria) - You can use an extra dose of the antihistamine, if needed, for breakthrough symptoms.  - Consider nasal saline rinses 1-2 times daily to remove allergens from the nasal cavities as well as help with mucous clearance (this is especially helpful to do before the nasal sprays are given) - Consider allergy shots as a means of long-term control. - Allergy shots "re-train" and "reset" the immune system to ignore environmental allergens and decrease the resulting immune response to those allergens (sneezing, itchy watery eyes, runny nose, nasal congestion, etc).    - Allergy shots improve symptoms in 75-85% of patients.  - We can discuss more at the next appointment if the medications are not working for you.  3. Return in about 3 months (around 01/03/2018).   Subjective:   Nicole Mclaughlin a 66 64o. female presenting today for evaluation of  Chief Complaint  Patient presents with  . Sinus Problem    runny nose, sneezing  . Urticaria  . Facial Swelling    [name] a history of the following: Patient Active Problem List   Diagnosis Date Noted  . Seasonal and perennial allergic rhinitis 10/03/2017  . Chronic urticaria 10/03/2017  . BMI 28.0-28.9,adult 08/22/2015    History  obtained from: chart review and patient.  Nicole Mclaughlin was referred by Wardell Honour, MD.     Nicole Mclaughlin is a 67 y.o. female presenting for an multitude of complaints including rhinorrhea, sneezing, and urticaria. She started having hives in December 2018. It typically happens after she eats, within hours of eating. Hives stick around until she takes Benadryl. It takes around two hours to start working. Hives clear up completely.  Hives occur all over her body. She denies fevers and joint pains. She denies changes to her diet and changes in medications. She uses fragrance free ointments and laundry detergent.   She did have a normal CBC and CMP in February 2019. However, she did have an elevated total cholesterol of (232). She has not had a workup for the hives specifically. She has not been started on a cholesterol medication at this time. Of note, she does have a history of ibuprofen and aspirin allergy, which results in swelling and hives. She denies any recent exposures to these medications. She has not tried a low salicylic diet at this time.   She does have environmental allergies. She was on shots for a period of time, probably around 38 years age. She was followed by Dr. Shaune Leeks. She does use an antihistamine regularly. She does take vitamin C to help with her allergies. She does get sinus infections around once per year. She does have a history of lactose intolerant but tolerates almond milk. She has drank soy milk at some point. She does eat flounder and salmon.   Otherwise, there is no history of other atopic diseases, including asthma, drug allergies, or stinging insect allergies. There is no significant infectious history. Vaccinations are up to date.    Past Medical History: Patient Active Problem List   Diagnosis Date Noted  . Seasonal and perennial allergic rhinitis 10/03/2017  . Chronic urticaria 10/03/2017  . BMI 28.0-28.9,adult 08/22/2015    Medication List:  Allergies as of 10/03/2017      Reactions   Aspirin Itching, Swelling   Motrin [ibuprofen] Itching, Swelling      Medication List        Accurate as of 10/03/17 12:22 PM. Always use your most recent med list.          CALCIUM PO Take 1 tablet by mouth daily.   diphenhydrAMINE 25 MG tablet Commonly known as:  BENADRYL Take 25 mg by mouth 2 (two) times daily as needed.   fluticasone 50 MCG/ACT nasal spray Commonly known as:   FLONASE Place 1 spray into both nostrils 2 (two) times daily.   MULTIVITAMIN ADULT PO Take 1 Package by mouth daily.   vitamin C 100 MG tablet Take 100 mg by mouth daily.       Birth History: non-contributory.   Developmental History: non-contributory.   Past Surgical History: Past Surgical History:  Procedure Laterality Date  . CESAREAN SECTION       Family History: Family History  Problem Relation Age of Onset  . Cancer Mother 54       Colon cancer age 32; multiple myeloma age 16.  . Stroke Father   . Hypertension Father   . Diabetes Sister   . Alcohol abuse Sister   . Breast cancer Neg Hx      Social History: Jalan lives at home by herself. She lives in a house with carpeting throughout the home. There are no animals inside or outside of the home. She has gas and electric heating with central  cooling. There are no dust mite coverings on the bedding. There is no smoking exposure. She is a retired Network engineer and retired in 2017. She is currently looking for a part time job.      Review of Systems: a 14-point review of systems is pertinent for what is mentioned in HPI.  Otherwise, all other systems were negative. Constitutional: negative other than that listed in the HPI Eyes: negative other than that listed in the HPI Ears, nose, mouth, throat, and face: negative other than that listed in the HPI Respiratory: negative other than that listed in the HPI Cardiovascular: negative other than that listed in the HPI Gastrointestinal: negative other than that listed in the HPI Genitourinary: negative other than that listed in the HPI Integument: negative other than that listed in the HPI Hematologic: negative other than that listed in the HPI Musculoskeletal: negative other than that listed in the HPI Neurological: negative other than that listed in the HPI Allergy/Immunologic: negative other than that listed in the HPI    Objective:   Blood pressure 140/82, pulse  92, temperature 98.4 F (36.9 C), temperature source Oral, resp. rate 20, height 5' 4"  (1.626 m), weight 163 lb 9.6 oz (74.2 kg). Body mass index is 28.08 kg/m.   Physical Exam:  General: Alert, interactive, in no acute distress. Pleasant, but somewhat guarded.  Eyes: No conjunctival injection bilaterally, no discharge on the right, no discharge on the left and no Horner-Trantas dots present. PERRL bilaterally. EOMI without pain. No photophobia.  Ears: Right TM pearly gray with normal light reflex, Left TM pearly gray with normal light reflex, Right TM intact without perforation and Left TM intact without perforation.  Nose/Throat: External nose within normal limits and septum midline. Turbinates edematous and pale with clear discharge. Posterior oropharynx erythematous with cobblestoning in the posterior oropharynx. Tonsils 2+ without exudates.  Tongue without thrush. Neck: Supple without thyromegaly. Trachea midline. Adenopathy: no enlarged lymph nodes appreciated in the anterior cervical, occipital, axillary, epitrochlear, inguinal, or popliteal regions. Lungs: Clear to auscultation without wheezing, rhonchi or rales. No increased work of breathing. CV: Normal S1/S2. No murmurs. Capillary refill <2 seconds.  Abdomen: Nondistended, nontender. No guarding or rebound tenderness. Bowel sounds present in all fields and hypoactive  Skin: Warm and dry, without lesions or rashes. Extremities:  No clubbing, cyanosis or edema. Neuro:   Grossly intact. No focal deficits appreciated. Responsive to questions.  Diagnostic studies:   Allergy Studies:   Indoor/Outdoor Percutaneous Adult Environmental Panel: positive to bahia grass, [place] grass, johnson grass, [place] blue grass, meadow fescue grass, perennial rye grass, sweet vernal grass, timothy grass, cocklebur, burweed marsh elder, short ragweed, giant ragweed, English plantain, lamb's quarters, sheep sorrel, rough pigweed, rough marsh elder,  common mugwort, ash, American beech, Box elder, red cedar, eastern cottonwood, elm, hickory, maple, oak, pecan pollen, pine, [place] sycamore, black walnut pollen, Aspergillus, Df mite, Dp mites and cockroach. Otherwise negative with adequate controls.  Selected Food Panel: equivocal reactions to Peanut (1x1), Soy (1x1), Wheat (1x1) and Sesame (1x1) with adequate controls. Negative to Milk, Egg, Casein, Shellfish Mix , Fish Mix and Cashew    Allergy testing results were read and interpreted by myself, documented by clinical staff.     Salvatore Marvel, MD [facility]

## 2017-10-03 NOTE — Progress Notes (Signed)
I will get that sent out today.

## 2017-10-03 NOTE — Patient Instructions (Addendum)
1. Chronic urticaria - Your history does not have any "red flags" such as fevers, joint pains, or permanent skin changes that would be concerning for a more serious cause of hives.  - Testing was slightly reactive to peanuts, soy, wheat, and sesame. - There is a the low positive predictive value of food allergy testing and hence the high possibility of false positives. - In contrast, food allergy testing has a high negative predictive value, therefore if testing is negative we can be relatively assured that they are indeed negative.  - Therefore, I would recommend stopping these in your diet for 2-4 weeks to see if the hives improve.  - If you do not notice an improvement in the hives, go ahead and restart these foods. - We will get some labs to rule out serious causes of hives: tryptase level, thyroid antibodies, and alpha gal panel. - Chronic hives are often times a self limited process and will "burn themselves out" over 6-12 months, although this is not always the case.  - In the meantime, start suppressive dosing of antihistamines:   - Morning: Allegra (fexofenadine) 180-360mg  (one to two tablets)  - Evening: Zyrtec (cetirizine) 10-20mg  (one to two tablets)  - If the above is not working, try adding: Zantac (ranitidine) 150mg  (two tablets) - You can change this dosing at home, decreasing the dose as needed or increasing the dosing as needed.  - If you are not tolerating the medications or are tired of taking them every day, we can start treatment with a monthly injectable medication called Xolair.   2. Chronic rhinitis - Testing today showed: trees, weeds, grasses, indoor molds, dust mites and cockroach - Avoidance measures provided. - Start taking: nasal saline rinses 1-2 times daily and antihistamines (as above for urticaria) - You can use an extra dose of the antihistamine, if needed, for breakthrough symptoms.  - Consider nasal saline rinses 1-2 times daily to remove allergens from the  nasal cavities as well as help with mucous clearance (this is especially helpful to do before the nasal sprays are given) - Consider allergy shots as a means of long-term control. - Allergy shots "re-train" and "reset" the immune system to ignore environmental allergens and decrease the resulting immune response to those allergens (sneezing, itchy watery eyes, runny nose, nasal congestion, etc).    - Allergy shots improve symptoms in 75-85% of patients.  - We can discuss more at the next appointment if the medications are not working for you.  3. Return in about 3 months (around 01/03/2018).   Please inform us of any Emergency Department visits, hospitalizations, or changes in symptoms. Call us before going to the ED for breathing or allergy symptoms since we might be able to fit you in for a sick visit. Feel free to contact us anytime with any questions, problems, or concerns.  It was a pleasure to meet you today!  Websites that have reliable patient information: 1. American Academy of Asthma, Allergy, and Immunology: www.aaaai.org 2. Food Allergy Research and Education (FARE): foodallergy.org 3. Mothers of Asthmatics: http://www.asthmacommunitynetwork.org 4. American College of Allergy, Asthma, and Immunology: www.acaai.org   Reducing Pollen Exposure  The American Academy of Allergy, Asthma and Immunology suggests the following steps to reduce your exposure to pollen during allergy seasons.    1. Do not hang sheets or clothing out to dry; pollen may collect on these items. 2. Do not mow lawns or spend time around freshly cut grass; mowing stirs up pollen. 3. Keep  windows closed at night.  Keep car windows closed while driving. 4. Minimize morning activities outdoors, a time when pollen counts are usually at their highest. 5. Stay indoors as much as possible when pollen counts or humidity is high and on windy days when pollen tends to remain in the air longer. 6. Use air conditioning  when possible.  Many air conditioners have filters that trap the pollen spores. 7. Use a HEPA room air filter to remove pollen form the indoor air you breathe.  Control of Mold Allergen   Mold and fungi can grow on a variety of surfaces provided certain temperature and moisture conditions exist.  Outdoor molds grow on plants, decaying vegetation and soil.  The major outdoor mold, Alternaria and Cladosporium, are found in very high numbers during hot and dry conditions.  Generally, a late Summer - Fall peak is seen for common outdoor fungal spores.  Rain will temporarily lower outdoor mold spore count, but counts rise rapidly when the rainy period ends.  The most important indoor molds are Aspergillus and Penicillium.  Dark, humid and poorly ventilated basements are ideal sites for mold growth.  The next most common sites of mold growth are the bathroom and the kitchen.   Indoor (Perennial) Mold Control   Positive indoor molds via skin testing: Aspergillus  1. Maintain humidity below 50%. 2. Clean washable surfaces with 5% bleach solution. 3. Remove sources e.g. contaminated carpets.     Control of House Dust Mite Allergen    House dust mites play a major role in allergic asthma and rhinitis.  They occur in environments with high humidity wherever human skin, the food for dust mites is found. High levels have been detected in dust obtained from mattresses, pillows, carpets, upholstered furniture, bed covers, clothes and soft toys.  The principal allergen of the house dust mite is found in its feces.  A gram of dust may contain 1,000 mites and 250,000 fecal particles.  Mite antigen is easily measured in the air during house cleaning activities.    1. Encase mattresses, including the box spring, and pillow, in an air tight cover.  Seal the zipper end of the encased mattresses with wide adhesive tape. 2. Wash the bedding in water of 130 degrees Farenheit weekly.  Avoid cotton comforters/quilts  and flannel bedding: the most ideal bed covering is the dacron comforter. 3. Remove all upholstered furniture from the bedroom. 4. Remove carpets, carpet padding, rugs, and non-washable window drapes from the bedroom.  Wash drapes weekly or use plastic window coverings. 5. Remove all non-washable stuffed toys from the bedroom.  Wash stuffed toys weekly. 6. Have the room cleaned frequently with a vacuum cleaner and a damp dust-mop.  The patient should not be in a room which is being cleaned and should wait 1 hour after cleaning before going into the room. 7. Close and seal all heating outlets in the bedroom.  Otherwise, the room will become filled with dust-laden air.  An electric heater can be used to heat the room. 8. Reduce indoor humidity to less than 50%.  Do not use a humidifier.  Control of Cockroach Allergen  Cockroach allergen has been identified as an important cause of acute attacks of asthma, especially in urban settings.  There are fifty-five species of cockroach that exist in the [place], however only three, the [place], [place] species produce allergen that can affect patients with Asthma.  Allergens can be obtained from fecal particles, egg casings and  secretions from cockroaches.    1. Remove food sources. 2. Reduce access to water. 3. Seal access and entry points. 4. Spray runways with 0.5-1% Diazinon or Chlorpyrifos 5. Blow boric acid power under stoves and refrigerator. 6. Place bait stations (hydramethylnon) at feeding sites.

## 2017-10-04 LAB — THYROID ANTIBODIES: THYROID PEROXIDASE ANTIBODY: 11 [IU]/mL (ref 0–34)

## 2017-10-06 ENCOUNTER — Telehealth: Payer: Self-pay | Admitting: Allergy & Immunology

## 2017-10-06 NOTE — Telephone Encounter (Signed)
Patient was seen 10-03-17, by Dr. Ernst Bowler. She was given a list of foods to avoid and has some questions about it and what to buy at the grocery store.

## 2017-10-06 NOTE — Telephone Encounter (Signed)
I spoke with patient and advised her to avoid all foods that she tested positive to and any foods on the salicylic acid.

## 2017-10-18 ENCOUNTER — Encounter: Payer: Self-pay | Admitting: Allergy & Immunology

## 2017-10-18 DIAGNOSIS — L508 Other urticaria: Secondary | ICD-10-CM

## 2017-10-20 NOTE — Addendum Note (Signed)
Addended by: Arvid Right on: 10/20/2017 09:49 AM   Modules accepted: Orders

## 2017-10-25 LAB — ALPHA-GAL PANEL
Class Interpretation: 0
Class Interpretation: 0
LAMB CLASS INTERPRETATION: 0
Lamb/Mutton (Ovis spp) IgE: <0.1 kU/L (ref <0.35)
Pork (Sus spp) IgE: <0.1 kU/L (ref <0.35)

## 2017-10-25 LAB — TRYPTASE: TRYPTASE: 2.6 ug/L (ref 2.2–13.2)

## 2017-12-20 ENCOUNTER — Encounter: Payer: Self-pay | Admitting: Family Medicine

## 2018-01-03 ENCOUNTER — Other Ambulatory Visit: Payer: Self-pay

## 2018-01-03 ENCOUNTER — Encounter (HOSPITAL_COMMUNITY): Payer: Self-pay | Admitting: Emergency Medicine

## 2018-01-03 ENCOUNTER — Ambulatory Visit (HOSPITAL_COMMUNITY)
Admission: EM | Admit: 2018-01-03 | Discharge: 2018-01-03 | Disposition: A | Discharge status: Home / Self Care | Payer: Medicare Other | Attending: Urgent Care | Admitting: Urgent Care

## 2018-01-03 DIAGNOSIS — M7989 Other specified soft tissue disorders: Secondary | ICD-10-CM

## 2018-01-03 DIAGNOSIS — Z889 Allergy status to unspecified drugs, medicaments and biological substances status: Secondary | ICD-10-CM

## 2018-01-03 DIAGNOSIS — M79645 Pain in left finger(s): Secondary | ICD-10-CM

## 2018-01-03 MED ORDER — CEPHALEXIN 500 MG PO CAPS: 500.0000 mg | ORAL_CAPSULE | Freq: Three times a day (TID) | ORAL | 0 refills | Status: DC

## 2018-01-03 NOTE — Discharge Instructions (Signed)
If you develop fever, redness, swelling, drainage of pus, then go ahead and start keflex and come back to the clinic for a recheck. Otherwise, leave the skin as it is and it will be replaced over time with new, healthy skin. Leave it open to the air, clean and dry. When you wash your hands or bathe just clean gently so as not to tear the skin off.

## 2018-01-03 NOTE — ED Provider Notes (Signed)
  MRN: [id number] DOB: 09/05/1950  Subjective:   Nicole Mclaughlin is a 67 y.o. female presenting for 3-week history of left middle finger issue.  Patient reports that she was walking outside in the park when she suddenly developed isolated left middle finger swelling and pain.  Reports that the finger was profoundly swollen and she was not able to bend it or move it.  Over the course of the next 3 weeks her pain improved and started swelling.  However today, she noticed that she had some gray skin over  the same area.  Today, she denies fever, redness, swelling, pain, drainage of pus or bleeding.  She reports that she has a history of allergic reactions to insect bites or stings and has had them happen over her hands in the past.  She has used Benadryl successfully for this.  She is not currently taking this as she no longer has swelling or any itching.  No current facility-administered medications for this encounter.   Current Outpatient Medications:  .  diphenhydrAMINE (BENADRYL) 25 MG tablet, Take 25 mg by mouth 2 (two) times daily as needed., Disp: , Rfl:  .  Ascorbic Acid (VITAMIN C) 100 MG tablet, Take 100 mg by mouth daily., Disp: , Rfl:  .  CALCIUM PO, Take 1 tablet by mouth daily., Disp: , Rfl:  .  fluticasone (FLONASE) 50 MCG/ACT nasal spray, Place 1 spray into both nostrils 2 (two) times daily., Disp: 16 g, Rfl: 1 .  Multiple Vitamins-Minerals (MULTIVITAMIN ADULT PO), Take 1 Package by mouth daily., Disp: , Rfl:    Allergies  Allergen Reactions  . Aspirin Itching and Swelling  . Motrin [Ibuprofen] Itching and Swelling    Past Medical History:  Diagnosis Date  . Allergy   . Anemia   . Angio-edema   . Blood transfusion without reported diagnosis   . Hyperlipidemia   . Urticaria      Past Surgical History:  Procedure Laterality Date  . CESAREAN SECTION      Objective:   Vitals: BP (!) 142/80 (BP Location: Right Arm)   Pulse 85   Temp 98.3 F (36.8 C) (Oral)   SpO2 97%    Physical Exam  Constitutional: She is oriented to person, place, and time. She appears well-developed and well-nourished.  Cardiovascular: Normal rate.  Pulmonary/Chest: Effort normal.  Musculoskeletal:       Hands: Neurological: She is alert and oriented to person, place, and time.    Assessment and Plan :   Finger swelling  Finger pain, left  History of allergic reaction  I suspect patient formed a blister like lesion from an allergic reaction.  Counseled on conservative management of this.  Provided patient with a prescription for Keflex in case she develops signs and symptoms of infection.  She is to return to clinic at that point as well.  Patient is in agreement with treatment plan.   Jaynee Eagles, [place] 01/03/18 2038

## 2018-01-03 NOTE — ED Triage Notes (Signed)
Pt reports pain in her left middle finger that started three weeks ago.  She had no ROM at that time.  She has regained ROM but now the skin has turned white.

## 2024-05-03 ENCOUNTER — Ambulatory Visit

## 2024-05-16 ENCOUNTER — Encounter: Payer: Self-pay | Admitting: Dermatology

## 2024-05-16 ENCOUNTER — Ambulatory Visit: Admitting: Dermatology

## 2024-05-16 ENCOUNTER — Encounter: Payer: Self-pay | Admitting: Nurse Practitioner

## 2024-05-16 ENCOUNTER — Ambulatory Visit: Admitting: Nurse Practitioner

## 2024-05-16 VITALS — BP 145/64 | HR 82

## 2024-05-16 VITALS — BP 179/75 | HR 82 | Wt 167.4 lb

## 2024-05-16 DIAGNOSIS — D229 Melanocytic nevi, unspecified: Secondary | ICD-10-CM | POA: Diagnosis not present

## 2024-05-16 DIAGNOSIS — L821 Other seborrheic keratosis: Secondary | ICD-10-CM

## 2024-05-16 DIAGNOSIS — Z1231 Encounter for screening mammogram for malignant neoplasm of breast: Secondary | ICD-10-CM | POA: Diagnosis not present

## 2024-05-16 DIAGNOSIS — Z1329 Encounter for screening for other suspected endocrine disorder: Secondary | ICD-10-CM

## 2024-05-16 DIAGNOSIS — Z13220 Encounter for screening for lipoid disorders: Secondary | ICD-10-CM | POA: Diagnosis not present

## 2024-05-16 DIAGNOSIS — Z0000 Encounter for general adult medical examination without abnormal findings: Secondary | ICD-10-CM | POA: Diagnosis not present

## 2024-05-16 NOTE — Addendum Note (Signed)
 Addended by: OLEY BASCOM RAMAN on: 05/16/2024 04:16 PM   Modules accepted: Level of Service

## 2024-05-16 NOTE — Patient Instructions (Addendum)

## 2024-05-16 NOTE — Progress Notes (Addendum)
 Subjective   Patient ID: Nicole Mclaughlin, female    DOB: Feb 24, 1951, 73 y.o.   MRN: [id number]  Chief Complaint  Patient presents with   Establish Care   Annual Exam    Moles under the left breast that are bothersome.     Referring provider: No ref. provider found  Nicole Mclaughlin is a 73 y.o. female with Past Medical History: No date: Allergy No date: Anemia No date: Angio-edema No date: Blood transfusion without reported diagnosis No date: Hyperlipidemia No date: Urticaria   HPI  Patient presents today to establish care.  Her blood pressure is elevated in office.  At patient states that she does have a history of whitecoat syndrome.  We will have her return in 1 week to recheck blood pressure again.  Her previous PCP was at [facility].  She has not seen a PCP since 2019.  We will check labs today for physical. Denies f/c/s, n/v/d, hemoptysis, PND, leg swelling Denies chest pain or edema  Note: Patient does have atypical moles under her left breast.  She would like a referral to dermatology for further evaluation.    Allergies  Allergen Reactions   Aspirin Itching and Swelling   Motrin [Ibuprofen] Itching and Swelling    Immunization History  Administered Date(s) Administered   Influenza,inj,Quad PF,6+ Mos 08/22/2015, 05/19/2016   Influenza-Unspecified 05/26/2017   Pneumococcal Conjugate-13 08/03/2016   Pneumococcal Polysaccharide-23 09/02/2017   Zoster, Live 08/17/2016    Tobacco History: Social History   Tobacco Use  Smoking Status Never  Smokeless Tobacco Never   Counseling given: Not Answered   Outpatient Encounter Medications as of 05/16/2024  Medication Sig   acetaminophen (TYLENOL) 500 MG tablet Take 500 mg by mouth every 6 (six) hours as needed.   diphenhydrAMINE (BENADRYL) 25 MG tablet Take 25 mg by mouth 2 (two) times daily as needed.   latanoprost (XALATAN) 0.005 % ophthalmic solution SMARTSIG:1 Drop(s) In Eye(s) Every Evening    [DISCONTINUED] Ascorbic Acid (VITAMIN C) 100 MG tablet Take 100 mg by mouth daily.   [DISCONTINUED] CALCIUM PO Take 1 tablet by mouth daily.   [DISCONTINUED] cephALEXin  (KEFLEX ) 500 MG capsule Take 1 capsule (500 mg total) by mouth 3 (three) times daily.   [DISCONTINUED] fluticasone  (FLONASE ) 50 MCG/ACT nasal spray Place 1 spray into both nostrils 2 (two) times daily.   [DISCONTINUED] Multiple Vitamins-Minerals (MULTIVITAMIN ADULT PO) Take 1 Package by mouth daily.   No facility-administered encounter medications on file as of 05/16/2024.    Review of Systems  Review of Systems  Constitutional: Negative.   HENT: Negative.    Cardiovascular: Negative.   Gastrointestinal: Negative.   Allergic/Immunologic: Negative.   Neurological: Negative.   Psychiatric/Behavioral: Negative.       Objective:   BP (!) 179/75 (BP Location: Left Arm, Patient Position: Sitting, Cuff Size: Normal)   Pulse 82   Wt 167 lb 6.4 oz (75.9 kg)   SpO2 97%   BMI 28.73 kg/m   Wt Readings from Last 5 Encounters:  05/16/24 167 lb 6.4 oz (75.9 kg)  10/03/17 163 lb 9.6 oz (74.2 kg)  09/12/17 160 lb (72.6 kg)  09/02/17 161 lb 4 oz (73.1 kg)  07/18/17 161 lb 6.4 oz (73.2 kg)     Physical Exam Vitals and nursing note reviewed.  Constitutional:      General: She is not in acute distress.    Appearance: She is well-developed.  Cardiovascular:     Rate and Rhythm: Normal rate and  regular rhythm.  Pulmonary:     Effort: Pulmonary effort is normal.     Breath sounds: Normal breath sounds.  Chest:       Comments: 2 large atypical moles noted under left breast Neurological:     Mental Status: She is alert and oriented to person, place, and time.       Assessment & Plan:   Atypical mole -     Ambulatory referral to Dermatology  Encounter for screening mammogram for malignant neoplasm of breast -     3D Screening Mammogram, Left and Right  Routine adult health maintenance -     CBC -      Comprehensive metabolic panel with GFR  Lipid screening -     Lipid panel  Thyroid  disorder screen -     TSH     Return in about 1 week (around 05/23/2024) for recheck BP.   Bascom GORMAN Borer, NP 05/16/2024

## 2024-05-16 NOTE — Progress Notes (Signed)
   New Patient Visit   Subjective  Nicole Mclaughlin is a 73 y.o. female who presents for the following: atypical mole  Patient states she has a mole located at the trunk and around left breast that she would like to have examined.  Patient reports the spot underneath the breast is sometimes tender and has been there for 9 years. Patient reports that the spot on the side of the breast is not tender and has only been there 3 years. Patient reports she has not previously been treated for these areas.  Patient has Hx of bx - spot removed 2018 on left side with benign results. Patient denies family history of skin cancer(s).  The patient has spots, moles and lesions to be evaluated, some may be new or changing and the patient may have concern these could be cancer.   The following portions of the chart were reviewed this encounter and updated as appropriate: medications, allergies, medical history  Review of Systems:  No other skin or systemic complaints except as noted in HPI or Assessment and Plan.  Objective  Well appearing patient in no apparent distress; mood and affect are within normal limits.  A focused examination was performed of the following areas: Left breast area  Relevant exam findings are noted in the Assessment and Plan.         Assessment & Plan   SEBORRHEIC KERATOSIS - Stuck-on, waxy, tan-brown papules and/or plaques  - Benign-appearing - Discussed benign etiology and prognosis. - Observe - Call for any changes   SEBORRHEIC KERATOSIS (3) Left Breast (3) Destruction of lesion - Left Breast (2)  Destruction method: cryotherapy   Timeout:  patient name, date of birth, surgical site, and procedure verified Cryotherapy cycles:  3 Outcome: patient tolerated procedure well with no complications   Post-procedure details: wound care instructions given    IRRITATED SEBORRHEIC KERATOSIS (8) Left Abdomen (side) - Upper, Left Flank (3), Right Abdomen (side) - Upper,  Right Flank (2), Right Inframammary Fold Destruction of lesion - Left Abdomen (side) - Upper, Left Flank (3)  Destruction method: cryotherapy   Timeout:  patient name, date of birth, surgical site, and procedure verified Cryotherapy cycles:  8 Outcome: patient tolerated procedure well with no complications   Post-procedure details: wound care instructions given     Return if symptoms worsen or fail to improve.  I, Lyle Cords, as acting as a Neurosurgeon for [employer], DO .   Documentation: I have reviewed the above documentation for accuracy and completeness, and I agree with the above.  Delon Lenis, DO

## 2024-05-17 ENCOUNTER — Ambulatory Visit: Payer: Self-pay | Admitting: Nurse Practitioner

## 2024-05-17 LAB — LIPID PANEL
Chol/HDL Ratio: 3.8 ratio (ref 0.0–4.4)
Cholesterol, Total: 240 mg/dL — ABNORMAL HIGH (ref 100–199)
HDL: 63 mg/dL (ref >39)
LDL Chol Calc (NIH): 161 mg/dL — ABNORMAL HIGH (ref 0–99)
Triglycerides: 93 mg/dL (ref 0–149)
VLDL Cholesterol Cal: 16 mg/dL (ref 5–40)

## 2024-05-17 LAB — COMPREHENSIVE METABOLIC PANEL WITH GFR
ALT: 13 IU/L (ref 0–32)
AST: 18 IU/L (ref 0–40)
Albumin: 4 g/dL (ref 3.8–4.8)
Alkaline Phosphatase: 69 IU/L (ref 49–135)
BUN/Creatinine Ratio: 14 (ref 12–28)
BUN: 13 mg/dL (ref 8–27)
Bilirubin Total: 0.6 mg/dL (ref 0.0–1.2)
CO2: 23 mmol/L (ref 20–29)
Calcium: 9.6 mg/dL (ref 8.7–10.3)
Chloride: 104 mmol/L (ref 96–106)
Creatinine, Ser: 0.96 mg/dL (ref 0.57–1.00)
Globulin, Total: 3 g/dL (ref 1.5–4.5)
Glucose: 76 mg/dL (ref 70–99)
Potassium: 4.2 mmol/L (ref 3.5–5.2)
Sodium: 142 mmol/L (ref 134–144)
Total Protein: 7 g/dL (ref 6.0–8.5)
eGFR: 62 mL/min/1.73 (ref >59)

## 2024-05-17 LAB — CBC
Hematocrit: 38.6 % (ref 34.0–46.6)
Hemoglobin: 12.7 g/dL (ref 11.1–15.9)
MCH: 29.2 pg (ref 26.6–33.0)
MCHC: 32.9 g/dL (ref 31.5–35.7)
MCV: 89 fL (ref 79–97)
Platelets: 255 x10E3/uL (ref 150–450)
RBC: 4.35 x10E6/uL (ref 3.77–5.28)
RDW: 11.5 % — ABNORMAL LOW (ref 11.7–15.4)
WBC: 5.9 x10E3/uL (ref 3.4–10.8)

## 2024-05-17 LAB — TSH: TSH: 1.56 u[IU]/mL (ref 0.450–4.500)

## 2024-05-24 ENCOUNTER — Encounter: Payer: Self-pay | Admitting: Nurse Practitioner

## 2024-05-24 ENCOUNTER — Ambulatory Visit: Payer: Self-pay | Admitting: Nurse Practitioner

## 2024-05-24 VITALS — BP 155/62 | HR 76 | Wt 164.8 lb

## 2024-05-24 DIAGNOSIS — I10 Essential (primary) hypertension: Secondary | ICD-10-CM

## 2024-05-24 MED ORDER — AMLODIPINE BESYLATE 5 MG PO TABS: 5.0000 mg | ORAL_TABLET | Freq: Every day | ORAL | 2 refills | Status: AC

## 2024-05-24 MED ORDER — BLOOD PRESSURE KIT: 1.0000 [IU] | PACK | Freq: Every day | 0 refills | Status: DC

## 2024-05-24 NOTE — Progress Notes (Signed)
   Subjective   Patient ID: Nicole Mclaughlin, female    DOB: 12-31-1950, 73 y.o.   MRN: [id number]  Chief Complaint  Patient presents with   Blood Pressure Check    Referring provider: No ref. provider found  Nicole Mclaughlin is a 73 y.o. female with Past Medical History: No date: Allergy No date: Anemia No date: Angio-edema No date: Blood transfusion without reported diagnosis No date: Hyperlipidemia No date: Urticaria   HPI  Patient presents for blood pressure recheck. Blood pressure is still elevated in office today. Will start amlodipine daily.  Will have patient return in a couple weeks to recheck blood pressure again. Denies f/c/s, n/v/d, hemoptysis, PND, leg swelling Denies chest pain or edema     Allergies  Allergen Reactions   Aspirin Itching and Swelling   Motrin [Ibuprofen] Itching and Swelling    Immunization History  Administered Date(s) Administered   Influenza,inj,Quad PF,6+ Mos 08/22/2015, 05/19/2016   Influenza-Unspecified 05/26/2017   Pneumococcal Conjugate-13 08/03/2016   Pneumococcal Polysaccharide-23 09/02/2017   Zoster, Live 08/17/2016    Tobacco History: Social History   Tobacco Use  Smoking Status Never  Smokeless Tobacco Never   Counseling given: Not Answered   Outpatient Encounter Medications as of 05/24/2024  Medication Sig   acetaminophen (TYLENOL) 500 MG tablet Take 500 mg by mouth every 6 (six) hours as needed.   amLODipine (NORVASC) 5 MG tablet Take 1 tablet (5 mg total) by mouth daily.   Blood Pressure KIT 1 Units by Does not apply route daily.   diphenhydrAMINE (BENADRYL) 25 MG tablet Take 25 mg by mouth 2 (two) times daily as needed.   latanoprost (XALATAN) 0.005 % ophthalmic solution SMARTSIG:1 Drop(s) In Eye(s) Every Evening   No facility-administered encounter medications on file as of 05/24/2024.    Review of Systems  Review of Systems  Constitutional: Negative.   HENT: Negative.    Cardiovascular: Negative.    Gastrointestinal: Negative.   Allergic/Immunologic: Negative.   Neurological: Negative.   Psychiatric/Behavioral: Negative.       Objective:   BP (!) 155/62   Pulse 76   Wt 164 lb 12.8 oz (74.8 kg)   SpO2 100%   BMI 28.29 kg/m   Wt Readings from Last 5 Encounters:  05/24/24 164 lb 12.8 oz (74.8 kg)  05/16/24 167 lb 6.4 oz (75.9 kg)  10/03/17 163 lb 9.6 oz (74.2 kg)  09/12/17 160 lb (72.6 kg)  09/02/17 161 lb 4 oz (73.1 kg)     Physical Exam Vitals and nursing note reviewed.  Constitutional:      General: She is not in acute distress.    Appearance: She is well-developed.  Cardiovascular:     Rate and Rhythm: Normal rate and regular rhythm.  Pulmonary:     Effort: Pulmonary effort is normal.     Breath sounds: Normal breath sounds.  Neurological:     Mental Status: She is alert and oriented to person, place, and time.       Assessment & Plan:   Primary hypertension -     amLODIPine Besylate; Take 1 tablet (5 mg total) by mouth daily.  Dispense: 90 tablet; Refill: 2 -     Blood Pressure; 1 Units by Does not apply route daily.  Dispense: 1 kit; Refill: 0     Return in about 1 week (around 05/31/2024) for recheck blood pressure.   Bascom GORMAN Borer, NP 05/24/2024

## 2024-05-31 ENCOUNTER — Ambulatory Visit (INDEPENDENT_AMBULATORY_CARE_PROVIDER_SITE_OTHER): Payer: Self-pay

## 2024-05-31 DIAGNOSIS — I10 Essential (primary) hypertension: Secondary | ICD-10-CM | POA: Diagnosis not present

## 2024-05-31 MED ORDER — BLOOD PRESSURE KIT: 1.0000 [IU] | PACK | Freq: Every day | 0 refills | Status: AC

## 2024-05-31 NOTE — Progress Notes (Signed)
   Blood Pressure Recheck Visit  Name: Nicole Mclaughlin MRN: [id number] Date of Birth: March 22, 1951  [name] presents today for Blood Pressure recheck with clinical support staff.  Order for BP recheck by Bascom Christmas, DNP, ordered on 05/24/24.   BP Readings from Last 3 Encounters:  05/24/24 (!) 155/62  05/16/24 (!) 145/64  05/16/24 (!) 179/75    Current Outpatient Medications  Medication Sig Dispense Refill   acetaminophen (TYLENOL) 500 MG tablet Take 500 mg by mouth every 6 (six) hours as needed.     amLODipine (NORVASC) 5 MG tablet Take 1 tablet (5 mg total) by mouth daily. 90 tablet 2   diphenhydrAMINE (BENADRYL) 25 MG tablet Take 25 mg by mouth 2 (two) times daily as needed.     latanoprost (XALATAN) 0.005 % ophthalmic solution SMARTSIG:1 Drop(s) In Eye(s) Every Evening     Blood Pressure KIT 1 Units by Does not apply route daily. (Patient not taking: Reported on 05/31/2024) 1 kit 0   No current facility-administered medications for this visit.    Hypertensive Medication Review: Patient states that they are taking all their hypertensive medications as prescribed and their last dose of hypertensive medications was yesterday   Documentation of any medication adherence discrepancies: none  Provider Recommendation:  Spoke to Bascom Borer, DNP and they stated: Continue on current medication and follow up in three months.  Patient has been scheduled to follow up with 08/31/24 at 11 am   Patient has been given provider's recommendations and does not have any questions or concerns at this time. Patient will contact the office for any future questions or concerns.   [name]   CMA II

## 2024-06-11 ENCOUNTER — Ambulatory Visit
Admission: RE | Admit: 2024-06-11 | Discharge: 2024-06-11 | Disposition: A | Source: Ambulatory Visit | Attending: Nurse Practitioner

## 2024-07-11 ENCOUNTER — Telehealth: Payer: Self-pay | Admitting: Nurse Practitioner

## 2024-07-11 NOTE — Telephone Encounter (Signed)
 Copied from CRM #[id number]. Topic: General - Call Back - No Documentation >> Jul 11, 2024  2:40 PM Avram G wrote: Reason for CRM: patient called in about a missed call. Check chart and did not see any notes.

## 2024-07-12 NOTE — Telephone Encounter (Signed)
 No call in chart. Nicole Mclaughlin

## 2024-07-13 ENCOUNTER — Telehealth: Payer: Self-pay | Admitting: Nurse Practitioner

## 2024-07-18 NOTE — Telephone Encounter (Signed)
 Error

## 2024-08-31 ENCOUNTER — Ambulatory Visit: Payer: Self-pay | Admitting: Nurse Practitioner

## 2024-09-03 ENCOUNTER — Ambulatory Visit: Admitting: Nurse Practitioner
